# Patient Record
Sex: Female | Born: 1965 | Race: White | Hispanic: No | Marital: Married | Smoking: Never smoker
Health system: Southern US, Community
[De-identification: ages and names within clinical notes are randomized; demographics above are authoritative.]

## PROBLEM LIST (undated history)

## (undated) DIAGNOSIS — B029 Zoster without complications: Secondary | ICD-10-CM

## (undated) DIAGNOSIS — K589 Irritable bowel syndrome without diarrhea: Secondary | ICD-10-CM

## (undated) DIAGNOSIS — C439 Malignant melanoma of skin, unspecified: Secondary | ICD-10-CM

## (undated) DIAGNOSIS — R002 Palpitations: Secondary | ICD-10-CM

## (undated) DIAGNOSIS — M3500 Sicca syndrome, unspecified: Principal | ICD-10-CM

## (undated) DIAGNOSIS — I671 Cerebral aneurysm, nonruptured: Secondary | ICD-10-CM

## (undated) HISTORY — PX: OTHER SURGICAL HISTORY: SHX169

## (undated) HISTORY — PX: LASIK: SHX215

## (undated) HISTORY — DX: Irritable bowel syndrome, unspecified: K58.9

## (undated) HISTORY — DX: Sicca syndrome, unspecified: M35.00

## (undated) HISTORY — PX: COLONOSCOPY: SHX174

## (undated) HISTORY — PX: ABDOMINAL HYSTERECTOMY: SHX81

## (undated) HISTORY — DX: Palpitations: R00.2

## (undated) HISTORY — PX: WISDOM TOOTH EXTRACTION: SHX21

## (undated) HISTORY — PX: BREAST BIOPSY: SHX20

## (undated) HISTORY — DX: Zoster without complications: B02.9

---

## 1999-01-22 ENCOUNTER — Other Ambulatory Visit: Admission: RE | Admit: 1999-01-22 | Discharge: 1999-01-22 | Payer: Self-pay | Admitting: Obstetrics & Gynecology

## 1999-05-17 ENCOUNTER — Encounter: Payer: Self-pay | Admitting: Otolaryngology

## 1999-05-17 ENCOUNTER — Ambulatory Visit (HOSPITAL_COMMUNITY): Admission: RE | Admit: 1999-05-17 | Discharge: 1999-05-17 | Payer: Self-pay | Admitting: Otolaryngology

## 2000-01-24 ENCOUNTER — Other Ambulatory Visit: Admission: RE | Admit: 2000-01-24 | Discharge: 2000-01-24 | Payer: Self-pay | Admitting: Obstetrics & Gynecology

## 2000-04-26 ENCOUNTER — Ambulatory Visit (HOSPITAL_COMMUNITY): Admission: RE | Admit: 2000-04-26 | Discharge: 2000-04-26 | Payer: Self-pay | Admitting: Obstetrics & Gynecology

## 2000-04-26 ENCOUNTER — Encounter: Payer: Self-pay | Admitting: Obstetrics & Gynecology

## 2001-02-12 ENCOUNTER — Other Ambulatory Visit: Admission: RE | Admit: 2001-02-12 | Discharge: 2001-02-12 | Payer: Self-pay | Admitting: Obstetrics & Gynecology

## 2002-02-14 ENCOUNTER — Other Ambulatory Visit: Admission: RE | Admit: 2002-02-14 | Discharge: 2002-02-14 | Payer: Self-pay | Admitting: Obstetrics & Gynecology

## 2002-04-26 ENCOUNTER — Encounter (INDEPENDENT_AMBULATORY_CARE_PROVIDER_SITE_OTHER): Payer: Self-pay | Admitting: Specialist

## 2002-04-26 ENCOUNTER — Observation Stay (HOSPITAL_COMMUNITY): Admission: RE | Admit: 2002-04-26 | Discharge: 2002-04-28 | Payer: Self-pay | Admitting: Obstetrics & Gynecology

## 2002-04-26 ENCOUNTER — Encounter: Payer: Self-pay | Admitting: Obstetrics & Gynecology

## 2003-03-10 ENCOUNTER — Other Ambulatory Visit: Admission: RE | Admit: 2003-03-10 | Discharge: 2003-03-10 | Payer: Self-pay | Admitting: Obstetrics & Gynecology

## 2003-03-12 ENCOUNTER — Encounter: Admission: RE | Admit: 2003-03-12 | Discharge: 2003-03-12 | Payer: Self-pay | Admitting: Obstetrics & Gynecology

## 2003-03-12 ENCOUNTER — Encounter: Payer: Self-pay | Admitting: Obstetrics & Gynecology

## 2003-06-04 ENCOUNTER — Ambulatory Visit (HOSPITAL_COMMUNITY): Admission: RE | Admit: 2003-06-04 | Discharge: 2003-06-04 | Payer: Self-pay | Admitting: Gastroenterology

## 2003-07-08 ENCOUNTER — Ambulatory Visit (HOSPITAL_COMMUNITY): Admission: RE | Admit: 2003-07-08 | Discharge: 2003-07-08 | Payer: Self-pay | Admitting: Gastroenterology

## 2003-07-08 ENCOUNTER — Encounter: Payer: Self-pay | Admitting: Gastroenterology

## 2003-08-14 ENCOUNTER — Ambulatory Visit (HOSPITAL_COMMUNITY): Admission: RE | Admit: 2003-08-14 | Discharge: 2003-08-14 | Payer: Self-pay | Admitting: General Surgery

## 2003-08-14 ENCOUNTER — Encounter (INDEPENDENT_AMBULATORY_CARE_PROVIDER_SITE_OTHER): Payer: Self-pay | Admitting: Specialist

## 2004-04-15 ENCOUNTER — Other Ambulatory Visit: Admission: RE | Admit: 2004-04-15 | Discharge: 2004-04-15 | Payer: Self-pay | Admitting: Obstetrics & Gynecology

## 2004-11-30 ENCOUNTER — Encounter: Admission: RE | Admit: 2004-11-30 | Discharge: 2004-11-30 | Payer: Self-pay | Admitting: Family Medicine

## 2005-05-18 ENCOUNTER — Other Ambulatory Visit: Admission: RE | Admit: 2005-05-18 | Discharge: 2005-05-18 | Payer: Self-pay | Admitting: Obstetrics & Gynecology

## 2006-02-07 ENCOUNTER — Ambulatory Visit: Payer: Self-pay | Admitting: Internal Medicine

## 2006-06-07 ENCOUNTER — Encounter: Admission: RE | Admit: 2006-06-07 | Discharge: 2006-06-07 | Payer: Self-pay | Admitting: Obstetrics & Gynecology

## 2006-07-24 ENCOUNTER — Encounter: Admission: RE | Admit: 2006-07-24 | Discharge: 2006-10-22 | Payer: Self-pay | Admitting: Otolaryngology

## 2006-09-15 ENCOUNTER — Emergency Department (HOSPITAL_COMMUNITY): Admission: EM | Admit: 2006-09-15 | Discharge: 2006-09-15 | Payer: Self-pay | Admitting: Emergency Medicine

## 2007-05-25 ENCOUNTER — Ambulatory Visit: Payer: Self-pay | Admitting: Internal Medicine

## 2007-06-05 ENCOUNTER — Encounter: Admission: RE | Admit: 2007-06-05 | Discharge: 2007-06-05 | Payer: Self-pay | Admitting: Internal Medicine

## 2007-07-17 ENCOUNTER — Encounter: Admission: RE | Admit: 2007-07-17 | Discharge: 2007-07-17 | Payer: Self-pay | Admitting: Obstetrics & Gynecology

## 2008-05-22 DIAGNOSIS — J45909 Unspecified asthma, uncomplicated: Secondary | ICD-10-CM | POA: Insufficient documentation

## 2008-05-22 DIAGNOSIS — K589 Irritable bowel syndrome without diarrhea: Secondary | ICD-10-CM | POA: Insufficient documentation

## 2009-05-14 ENCOUNTER — Ambulatory Visit (HOSPITAL_COMMUNITY): Admission: RE | Admit: 2009-05-14 | Discharge: 2009-05-14 | Payer: Self-pay | Admitting: Internal Medicine

## 2009-08-16 ENCOUNTER — Emergency Department (HOSPITAL_COMMUNITY): Admission: EM | Admit: 2009-08-16 | Discharge: 2009-08-16 | Payer: Self-pay | Admitting: Emergency Medicine

## 2009-08-22 ENCOUNTER — Ambulatory Visit: Payer: Self-pay | Admitting: Family Medicine

## 2009-08-25 ENCOUNTER — Encounter: Admission: RE | Admit: 2009-08-25 | Discharge: 2009-08-25 | Payer: Self-pay | Admitting: Internal Medicine

## 2009-09-28 ENCOUNTER — Ambulatory Visit (HOSPITAL_COMMUNITY): Admission: RE | Admit: 2009-09-28 | Discharge: 2009-09-28 | Payer: Self-pay | Admitting: Specialist

## 2009-10-07 ENCOUNTER — Encounter: Admission: RE | Admit: 2009-10-07 | Discharge: 2009-10-07 | Payer: Self-pay | Admitting: Obstetrics & Gynecology

## 2010-11-16 ENCOUNTER — Encounter
Admission: RE | Admit: 2010-11-16 | Discharge: 2010-11-16 | Payer: Self-pay | Source: Home / Self Care | Attending: Obstetrics & Gynecology | Admitting: Obstetrics & Gynecology

## 2010-12-02 NOTE — Assessment & Plan Note (Signed)
Summary: Cough- green, sorethroat x 1 wk rm 3   Vital Signs:  Patient Profile:   45 Years Old Female CC:      Cold & URI symptoms Height:     65.5 inches Weight:      120 pounds O2 Sat:      100 % O2 treatment:    Room Air Temp:     98.1 degrees F oral Pulse rate:   88 / minute Pulse rhythm:   regular Resp:     16 per minute BP sitting:   116 / 71  (right arm) Cuff size:   regular  Vitals Entered By: Areta Haber CMA (August 22, 2009 11:22 AM)                  Prior Medication List:  SINGULAIR 10 MG  TABS (MONTELUKAST SODIUM) take 1 by mouth once daily for allergies CALCIUM 500 MG  TABS (CALCIUM CARBONATE) take 1 by mouth once daily MULTIVITAMINS   TABS (MULTIPLE VITAMIN) take 1 by mouth once daily VITAMIN D 1000 UNIT  TABS (CHOLECALCIFEROL) CHECK DOSING WITH PT   Current Allergies (reviewed today): ! * SUNSCREEN  History of Present Illness Chief Complaint: Cold & URI symptoms History of Present Illness: Coughing and cold for 8-9 days. Seen at [facility] urgent care a week ago not better. Rapid strep was negative.Patient feels miserable and has been coughng and w/ as sore throat. Productive cough worse in AM . Green and thick.  Current Problems: ACUTE NASOPHARYNGITIS (ICD-460) UPPER RESPIRATORY INFECTION, ACUTE (ICD-465.9) BRONCHITIS, ACUTE (ICD-466.0) FAMILY HISTORY DIABETES 1ST DEGREE RELATIVE (ICD-V18.0) IRRITABLE BOWEL SYNDROME (ICD-564.1) COUGH (ICD-786.2) ASTHMA (ICD-493.90)   Current Meds SINGULAIR 10 MG  TABS (MONTELUKAST SODIUM) take 1 by mouth once daily for allergies CALCIUM 500 MG  TABS (CALCIUM CARBONATE) take 1 by mouth once daily MULTIVITAMINS   TABS (MULTIPLE VITAMIN) take 1 by mouth once daily VITAMIN D 1000 UNIT  TABS (CHOLECALCIFEROL) CHECK DOSING WITH PT ZITHROMAX Z-PAK 250 MG  TABS (AZITHROMYCIN) Use as directed TUSSIONEX PENNKINETIC ER 8-10 MG/5ML LQCR (CHLORPHENIRAMINE-HYDROCODONE) 1 tsp by mouth twice aday PROVENTIL HFA 108 (90 BASE)  MCG/ACT AERS (ALBUTEROL SULFATE) 2 puff 3x aday as needed for bronchitis  REVIEW OF SYSTEMS Constitutional Symptoms      Denies fever, chills, night sweats, weight loss, weight gain, and fatigue.  Eyes       Denies change in vision, eye pain, eye discharge, glasses, contact lenses, and eye surgery. Ear/Nose/Throat/Mouth       Complains of sore throat.      Denies hearing loss/aids, change in hearing, ear pain, ear discharge, dizziness, frequent runny nose, frequent nose bleeds, sinus problems, hoarseness, and tooth pain or bleeding.  Respiratory       Complains of productive cough and shortness of breath.      Denies dry cough, wheezing, asthma, bronchitis, and emphysema/COPD.      Comments: hx of reactive airway Cardiovascular       Denies murmurs, chest pain, and tires easily with exhertion.    Gastrointestinal       Denies stomach pain, nausea/vomiting, diarrhea, constipation, blood in bowel movements, and indigestion. Genitourniary       Denies painful urination, kidney stones, and loss of urinary control. Neurological       Denies paralysis, seizures, and fainting/blackouts. Musculoskeletal       Denies muscle pain, joint pain, joint stiffness, decreased range of motion, redness, swelling, muscle weakness, and gout.  Skin  Denies bruising, unusual mles/lumps or sores, and hair/skin or nail changes.  Psych       Denies mood changes, temper/anger issues, anxiety/stress, speech problems, depression, and sleep problems. Other Comments: Cough - green x 1 wk   Past History:  Family History: Last updated: 08/22/2009 Family History of Arthritis Family History Diabetes 1st degree relative Family History of Cardiovascular disorder  Past Medical History: Asthma Mouth sores  Past Surgical History: Sinus surgery Lasik  Family History: Reviewed history and no changes required. Family History of Arthritis Family History Diabetes 1st degree relative Family History of  Cardiovascular disorder Physical Exam General appearance: well developed, well nourished, moderate discomfort Head: normocephalic, atraumatic Ears: normal, no lesions or deformities Nasal: pale, boggy, swollen nasal turbinates Oral/Pharynx: pharyngeal erythema without exudate, uvula midline without deviation Neck: neck supple,  trachea midline, no masses Chest/Lungs: no rales, wheezes, or rhonchi bilateral, breath sounds equal without effort Heart: regular rate and  rhythm, no murmur Skin: no obvious rashes or lesions MSE: oriented to time, place, and person Assessment New Problems: ACUTE NASOPHARYNGITIS (ICD-460) UPPER RESPIRATORY INFECTION, ACUTE (ICD-465.9) BRONCHITIS, ACUTE (ICD-466.0) FAMILY HISTORY DIABETES 1ST DEGREE RELATIVE (ICD-V18.0)  bronchitis w/ Hx of reactive airway DX   Patient Education: Patient and/or caregiver instructed in the following: rest fluids and Tylenol.  Plan New Medications/Changes: PROVENTIL HFA 108 (90 BASE) MCG/ACT AERS (ALBUTEROL SULFATE) 2 puff 3x aday as needed for bronchitis  #1 x 0, 08/22/2009, Hassan Rowan MD TUSSIONEX PENNKINETIC ER 8-10 MG/5ML LQCR (CHLORPHENIRAMINE-HYDROCODONE) 1 tsp by mouth twice aday  #30floz x 0, 08/22/2009, Hassan Rowan MD ZITHROMAX Z-PAK 250 MG  TABS (AZITHROMYCIN) Use as directed  #1 x 0, 08/22/2009, Hassan Rowan MD  New Orders: Est. Patient Level III [16109] Rapid Strep [60454] Planning Comments:   will give work note if needed  Follow Up: Follow up in 2-3 days if no improvement Work/School Excuse: Return to work/school in 3 days  The patient and/or caregiver has been counseled thoroughly with regard to medications prescribed including dosage, schedule, interactions, rationale for use, and possible side effects and they verbalize understanding.  Diagnoses and expected course of recovery discussed and will return if not improved as expected or if the condition worsens. Patient and/or caregiver verbalized  understanding.  Prescriptions: PROVENTIL HFA 108 (90 BASE) MCG/ACT AERS (ALBUTEROL SULFATE) 2 puff 3x aday as needed for bronchitis  #1 x 0   Entered and Authorized by:   Hassan Rowan MD   Signed by:   Hassan Rowan MD on 08/22/2009   Method used:   Print then Give to Patient   RxID:   [id number] TUSSIONEX PENNKINETIC ER 8-10 MG/5ML LQCR (CHLORPHENIRAMINE-HYDROCODONE) 1 tsp by mouth twice aday  #71floz x 0   Entered and Authorized by:   Hassan Rowan MD   Signed by:   Hassan Rowan MD on 08/22/2009   Method used:   Print then Give to Patient   RxID:   [id number] ZITHROMAX Z-PAK 250 MG  TABS (AZITHROMYCIN) Use as directed  #1 x 0   Entered and Authorized by:   Hassan Rowan MD   Signed by:   Hassan Rowan MD on 08/22/2009   Method used:   Print then Give to Patient   RxID:   [id number]   Patient Instructions: 1)  Please schedule a follow-up appointment as needed. 2)  Please schedule an appointment with your primary doctor in : 3)  Take your antibiotic as prescribed until ALL of it is gone, but stop if you develop  a rash or swelling and contact our office as soon as possible. 4)  Acute bronchitis symptoms for less than 10 days are not helped by antibiotics. take over the counter cough medications. call if no improvment in  5-7 days, sooner if increasing cough, fever, or new symptoms( shortness of breath, chest pain). 5)  Recommended remaining out of work for

## 2010-12-02 NOTE — Letter (Signed)
Summary: Out of Work  [facility]  [address]   [place], [place] [address]   Phone: [phone]  Fax: [phone]    August 22, 2009   Employee:  [employer]    To Whom It May Concern:   For Medical reasons, please excuse the above named employee from work for the following dates:  Start:   08/22/2009  Return:   08/25/2009  If you need additional information, please feel free to contact our office.         Sincerely,    Hassan Rowan MD

## 2010-12-29 ENCOUNTER — Other Ambulatory Visit: Payer: Self-pay | Admitting: Dermatology

## 2011-01-06 ENCOUNTER — Other Ambulatory Visit: Payer: Self-pay | Admitting: Dermatology

## 2011-02-03 LAB — POCT RAPID STREP A (OFFICE): Streptococcus, Group A Screen (Direct): NEGATIVE

## 2011-03-15 NOTE — Assessment & Plan Note (Signed)
[facility]                             PULMONARY OFFICE NOTE   Stefanie [name], Stefanie [name]                        MRN:          [id number]  DATE:05/25/2007                            DOB:          1966-06-20    PROBLEM:  Asthma/cough.   HISTORY:  She finds Singulair helps her recover more quickly from  bronchitis associated with viral illnesses. If she skips it, she says  that she coughs persistently, but rarely wheezes. She was recently  treated for pneumonia as an outpatient with doxycycline and symptoms  have completely resolved. A chest x-ray was done at Dr. Jone Baseman  office. She saw Dr. Lazarus Salines for persistent hoarseness and he noted some  non-specific laryngeal thickening by her description, but no evidence of  reflux and I cannot get a history suggesting reflux from her at this  time.   MEDICATIONS:  1. Singulair.  2. Calcium.  3. Multivitamins.  4. Vitamin D.   OBJECTIVE:  Weight 120 pounds, blood pressure 98/52, pulse 65, room air  saturation 100%. She is not hoarse, breathing is unlabored, and there is  no strider. There is no obvious post-nasal drainage. Lung fields sound  clear. Heart sounds are normal.   IMPRESSION:  We have felt that this was probably cough equivalent asthma  and Singulair seems to do a good job of controlling her.   PLAN:  We discussed options. She can try dropping the Singulair dose to  1/2 of a 10 mg daily to save money if that will work for her. Schedule  return 1 year, earlier p.r.n.     Clinton D. Maple Hudson, MD, Tonny Bollman, FACP  Electronically Signed    CDY/MedQ  DD: 05/25/2007  DT: 05/27/2007  Job #: [id number]   cc:   Thora Lance, M.D.  Amy Y. [place], M.D.  Gloris Manchester. Lazarus Salines, M.D.

## 2011-03-18 NOTE — H&P (Signed)
[facility]  Patient:    Stefanie [name], Stefanie [name] Visit Number: [id number] MRN: [id number]          Service Type: Attending:  Freddy Finner, M.D. Dictated by:   Freddy Finner, M.D. Adm. Date:  05/07/02                           History and Physical  ADMITTING DIAGNOSIS:  Uterine adenomyosis, pelvic endometriosis, chronic pelvic pain, multiparity.  HISTORY OF PRESENT ILLNESS:  Patient is a 45 year old white married female with two living children who is currently using an IUD for contraception who has a long history of pelvic pain but has noted a marked increase in pain over the last two to three months.  The pain is concentrated in the lower abdomen, greatest on the left but present bilaterally.  On pelvic examination in the office, her uterus is noted to be enlarged, retroverted in position, 8 to [redacted] weeks gestational size, and moderately tender to palpation.  There is left adnexal tenderness but no definite palpable mass.  The right adnexa is palpably normal.  In considering options of therapy, we have discussed at length laparoscopy with possible uterosacral nerve ablation.  Given the fact that she has completed her family, she has requested a more definitive surgical intervention given her abnormal physical findings and the chronic nature of her symptoms, and she is admitted now for laparoscopically assisted vaginal hysterectomy, and bilateral salpingo-oophorectomy only if findings are significantly abnormal with the ovaries.  There is a cystic mass noted in the myometrium on pelvic ultrasound measuring 5 x 4 mm and this is consistent with adenomyosis.  REVIEW OF SYSTEMS:  Her current review of systems is otherwise negative but no cardiopulmonary, GI, or GU complaints.  PAST MEDICAL HISTORY:  Patient is known to have irritable bowel symptoms which have been adequately treated with diet.  She has a history of migraine headaches.  She has no other known  significant medical illnesses.  ALLERGIES:  She is known to be allergic to SULFA but no other medication allergies.  PAST SURGICAL HISTORY:  Surgical delivery x 2, most recent in 1999.  She has no significant surgical procedures.  She has never had a blood transfusion, does not use cigarettes.  FAMILY HISTORY:  Noncontributory.  PHYSICAL EXAMINATION:  VITAL SIGNS:  Blood pressure in the office was 100/69.  HEENT:  Grossly within normal limits.  Thyroid gland is not palpably enlarged.  BREASTS:  Exam is considered to be normal.  HEART:  Normal sinus rhythm without murmurs, rubs, or gallops.  CHEST:  Clear to auscultation.  ABDOMEN:  Soft.  There is some tenderness to deep palpation in the left lower quadrant.  PELVIC:  The external genitalia, vagina, and cervix are normal.  Bimanual reveals the uterus to be 8 to 9 weeks size and mild to moderately tender. There is tender fullness in the left adnexa.  RECTUM:  Normal.  Rectovaginal exam confirms the above findings.  EXTREMITIES:  Without cyanosis, clubbing, or edema.  ASSESSMENT:  Known pelvic endometriosis, known tender uterus consistent with adenomyosis.  PLAN:  Laparoscopically assisted vaginal hysterectomy. Dictated by:   Freddy Finner, M.D. Attending:  Freddy Finner, M.D. DD:  04/24/02 TD:  04/24/02 Job: [id number]

## 2011-03-18 NOTE — Op Note (Signed)
NAME:  Stefanie [name], Stefanie [name]                           ACCOUNT NO.:  [id number]   MEDICAL RECORD NO.:  [id number]                   PATIENT TYPE:  AMB   LOCATION:  DAY                                  FACILITY:  [facility]   PHYSICIAN:  Timothy E. Earlene Plater, M.D.              DATE OF BIRTH:  1965/11/11   DATE OF PROCEDURE:  08/14/2003  DATE OF DISCHARGE:                                 OPERATIVE REPORT   PREOPERATIVE DIAGNOSES:  1. Skin lesion, left breast.  2. Skin breast mass, left breast.   OPERATIVE PROCEDURES:  1. Excision of breast mass.  2. Excision of skin lesion.   SURGEON:  Timothy E. Earlene Plater, M.D.   ANESTHESIA:  Local standby.   Ms. Asebedo saw me earlier this week for a more painful and enlarging mass,  left breast.  By mutual agreement we agreed to proceed with excisional  biopsy.  This is located in the 6 o'clock position, left breast.  She also  has a multicolored, irregular skin lesion in the upper outer quadrant skin  of left breast.  We will remove that at the same time.  She is identified  and the permit signed.  The lesions are identified and marked.   She is taken to the operating room, placed supine, IV sedation given.  The  left breast was prepped and draped in the usual fashion.  Marcaine 0.25%  Marcaine was used throughout for local anesthesia.  A horizontal incision  was made in the 6 o'clock position of the breast approximately 4 cm from the  areolar edge over the palpable lesion.  The scant subcutaneous tissue was  dissected.  The irregular mass was identified and grasped and sharply  dissected from the surrounding tissue.  Bleeding was carefully controlled.  The wound was dry.  The wound was closed with 3-0 Vicryl and 4-0 Monocryl.  Specimen submitted:  #1, breast mass.  The multicolored skin lesion on the  skin of the upper outer quadrant was likewise locally anesthetized and  excised as an ellipsed, undermined the edges, and closed with  layers of 4-0 Monocryl and  4-0 nylon.  Submitted as, #2, skin lesion.  She  tolerated it well, Steri-Strips applied, dry sterile dressing applied, and  she was removed to the recovery room in good condition.  Careful written and  verbal instructions were given, including Vicodin #24, refill one, and she  will be followed as an outpatient.                                                 Timothy E. Earlene Plater, M.D.    TED/MEDQ  D:  08/14/2003  T:  08/14/2003  Job:  [id number]   cc:   Freddy Finner, M.D.  [address]  [place]  [place]  [place] [address]  Fax: [phone]

## 2011-03-18 NOTE — H&P (Signed)
[facility]  Patient:    Stefanie [name], Stefanie [name] Visit Number: [id number] MRN: [id number]          Service Type: Attending:  Freddy Finner, M.D. Dictated by:   Freddy Finner, M.D. Adm. Date:  04/26/02                           History and Physical  ADDENDUM TO [phone]  I noted in there that the date of her surgery at admission would be May 07, 2002, but that date has been changed.  Her new admission date is April 26, 2002.  The date of the dictation was April 24, 2002. Dictated by:   Freddy Finner, M.D. Attending:  Freddy Finner, M.D. DD:  04/24/02 TD:  04/24/02 Job: [id number]

## 2011-03-18 NOTE — Op Note (Signed)
[facility]  Patient:    Stefanie [name], Stefanie [name] Visit Number: [id number] MRN: [id number]          Service Type: SUR Location: 4W 0455 01 Attending Physician:  Minette Headland Dictated by:   Freddy Finner, M.D. Proc. Date: 04/26/02 Admit Date:  04/26/2002                             Operative Report  PREOPERATIVE DIAGNOSES:  Enlarged tender uterus consistent with adenomyosis, chronic pelvic pain with left pelvic pain greater than right.  POSTOPERATIVE DIAGNOSES:  Enlarged tender uterus consistent with adenomyosis, chronic pelvic pain with left pelvic pain greater than right. Modeled irregular enlargement of the uterus. There was a follicular appearing totally benign cyst in the left ovary which was left in place. There was no evidence of peritoneal disease. The appendix was normal as was the upper abdomen by scanning inspection. Photographs of the findings were made and retained in the office record.  DESCRIPTION OF PROCEDURE:  The patient was admitted on the morning of surgery, brought to the operating room after receiving a gram of Cefotan and being placed in PAS hose. The abdomen, perineum and vagina were prepped in the usual fashion, sterile drapes were applied, the bladder was evacuated with a Robinson catheter. A Hulka tenaculum was attached to the cervix without difficulty. Two small incisions were made in the abdomen, one at the umbilicus through which a 12 mm trocar was introduced while elevating the anterior abdominal wall. Direction inspection revealed adequate placement and no evidence of injury on entry. Pneumoperitoneum was allowed to accumulated. A 5 mm trocar was placed through a lower small incision and through it a probe and later the Nezhat irrigating system was used. After systematically examining the pelvis and the abdomen, it was elected to proceed with the dissection vaginally because of the adequate descent of the uterus and  the lack of availability of the tripolar dissecting and coagulation device that would operate through the operating channel of the laparoscope. A posterior weighted vaginal retractor was then placed. Deavers were used to retract the anterior and lateral vaginal walls. The cervix was grasped with a Jacobs tenaculum, colpotomy incision was made while tinting the mucosa posterior to the cervix. The cervix was circumscribed with a scalpel. Curved Heaneys were used to cross clamp the uterosacrals which were divided and ligated with suture ligature of #0 Monocryl in a Heaney fashion. The ligasure system was then used to develop the bladder pillars which were sealed and divided. The bladder was advanced off the cervix, the cardinal ligament pedicle was taken with the ligasure system sealed and divided. The anterior peritoneum was entered. Vessel pedicles were taken with the Ligasure system as well as one pedicle just above the vessels on each side. Each of these were sealed and divided sharply. The uterus was delivered through the vaginal introitus. The ligasure system was then used to seal the ovarian pedicles on each side and the uterus was completely excised. The angles of the vagina were then anchored to the uterosacrals with a mattress suture of #0 Monocryl. The uterosacrals were plicated and the posterior peritoneum closed with an interrupted #0 Monocryl suture. The cuff was closed vertically with figure-of-eights of #0 Monocryl. A Foley catheter was placed. Reinspection laparoscopically revealed complete hemostasis. The procedure was terminated, gas was allowed to escape from the abdomen. The umbilical incision was closed in two layers. The #0 Vicryl was  used to close the fascia on a UR-6 needle. The skin was closed with interrupted subcuticular sutures of 3-0 Dexon. The 0.5% plain Marcaine was injected into the incision sites for postoperative analgesia. Steri-Strips were applied to the  incision sites. The patient was awakened and taken to recovery in good condition. Dictated by:   Freddy Finner, M.D. Attending Physician:  Minette Headland DD:  04/26/02 TD:  04/26/02 Job: [id number]

## 2011-06-20 ENCOUNTER — Other Ambulatory Visit (HOSPITAL_COMMUNITY): Payer: Self-pay | Admitting: Neurology

## 2011-06-20 DIAGNOSIS — R251 Tremor, unspecified: Secondary | ICD-10-CM

## 2011-06-20 DIAGNOSIS — R439 Unspecified disturbances of smell and taste: Secondary | ICD-10-CM

## 2011-06-20 DIAGNOSIS — R259 Unspecified abnormal involuntary movements: Secondary | ICD-10-CM

## 2011-06-21 ENCOUNTER — Ambulatory Visit (HOSPITAL_COMMUNITY)
Admission: RE | Admit: 2011-06-21 | Discharge: 2011-06-21 | Disposition: A | Discharge status: Home / Self Care | Payer: 59 | Source: Ambulatory Visit | Attending: Neurology | Admitting: Neurology

## 2011-06-21 DIAGNOSIS — R251 Tremor, unspecified: Secondary | ICD-10-CM

## 2011-06-21 DIAGNOSIS — R259 Unspecified abnormal involuntary movements: Secondary | ICD-10-CM | POA: Insufficient documentation

## 2011-06-21 DIAGNOSIS — R439 Unspecified disturbances of smell and taste: Secondary | ICD-10-CM | POA: Insufficient documentation

## 2011-06-21 LAB — VITAMIN B12: Vitamin B-12: 355 pg/mL (ref 211–911)

## 2011-06-21 MED ORDER — GADOBENATE DIMEGLUMINE 529 MG/ML IV SOLN
10.0000 mL | Freq: Once | INTRAVENOUS | Status: AC
  Administered 2011-06-21: 10 mL via INTRAVENOUS

## 2011-11-01 DIAGNOSIS — C439 Malignant melanoma of skin, unspecified: Secondary | ICD-10-CM

## 2011-11-01 HISTORY — DX: Malignant melanoma of skin, unspecified: C43.9

## 2011-11-07 ENCOUNTER — Other Ambulatory Visit: Payer: Self-pay | Admitting: Obstetrics & Gynecology

## 2011-11-07 DIAGNOSIS — Z1231 Encounter for screening mammogram for malignant neoplasm of breast: Secondary | ICD-10-CM

## 2011-11-23 ENCOUNTER — Ambulatory Visit
Admission: RE | Admit: 2011-11-23 | Discharge: 2011-11-23 | Disposition: A | Discharge status: Home / Self Care | Payer: 59 | Source: Ambulatory Visit | Attending: Obstetrics & Gynecology | Admitting: Obstetrics & Gynecology

## 2011-11-23 DIAGNOSIS — Z1231 Encounter for screening mammogram for malignant neoplasm of breast: Secondary | ICD-10-CM

## 2011-12-28 ENCOUNTER — Emergency Department
Admission: EM | Admit: 2011-12-28 | Discharge: 2011-12-28 | Disposition: A | Discharge status: Home Health | Payer: 59 | Source: Home / Self Care | Attending: Emergency Medicine | Admitting: Emergency Medicine

## 2011-12-28 ENCOUNTER — Encounter: Payer: Self-pay | Admitting: Emergency Medicine

## 2011-12-28 DIAGNOSIS — J329 Chronic sinusitis, unspecified: Secondary | ICD-10-CM

## 2011-12-28 DIAGNOSIS — J069 Acute upper respiratory infection, unspecified: Secondary | ICD-10-CM

## 2011-12-28 MED ORDER — AZITHROMYCIN 250 MG PO TABS: ORAL_TABLET | ORAL | Status: AC

## 2011-12-28 NOTE — ED Notes (Signed)
Had URI with Flu-like symptoms week of Feb.11th; never totally resolved and wonders if now has sinus infection. Did have Flu vaccination this season.

## 2011-12-28 NOTE — ED Provider Notes (Signed)
History     CSN: [id number]  Arrival date & time 12/28/11  1859   First MD Initiated Contact with Patient 12/28/11 1907      Chief Complaint  Patient presents with  . Sinusitis    (Consider location/radiation/quality/duration/timing/severity/associated sxs/prior treatment) HPI Stefanie [name] is a 47 y.o. female who complains of onset of cold symptoms for  10 days.  + Flu vaccine this year.  She started off mostly with flulike symptoms in those toes since resolved, however she continues to have some sinus pressure and she wonders if she has sinus infection now. Her current symptoms are: No sore throat +/- cough No pleuritic pain No wheezing + nasal congestion + mild hoarseness + post-nasal drainage + sinus pain/pressure No chest congestion No itchy/red eyes No earache No hemoptysis No SOB No chills/sweats No fever No nausea No vomiting No abdominal pain No diarrhea No skin rashes No fatigue No myalgias + headache    History reviewed. No pertinent past medical history.  Past Surgical History  Procedure Date  . Speenoidectomy   . Lasik   . Abdominal hysterectomy     Family History  Problem Relation Age of Onset  . Rheum arthritis Mother   . Diabetes Father   . Hypertension Father     History  Substance Use Topics  . Smoking status: Never Smoker   . Smokeless tobacco: Not on file  . Alcohol Use: No    OB History    Grav Para Term Preterm Abortions TAB SAB Ect Mult Living                  Review of Systems  All other systems reviewed and are negative.    Allergies  Morphine and related; Sulfa antibiotics; and Sunscreen  Home Medications   Current Outpatient Rx  Name Route Sig Dispense Refill  . AZITHROMYCIN 250 MG PO TABS  Use as directed 1 each 0    BP 119/80  Pulse 61  Temp(Src) 97.8 F (36.6 C) (Oral)  Resp 18  Ht 5' 2.5" (1.588 m)  Wt 115 lb (52.164 kg)  BMI 20.70 kg/m2  SpO2 100%  Physical Exam  Nursing note and vitals  reviewed. Constitutional: She is oriented to person, place, and time. She appears well-developed and well-nourished.  HENT:  Head: Normocephalic and atraumatic.  Right Ear: Tympanic membrane, external ear and ear canal normal.  Left Ear: Tympanic membrane, external ear and ear canal normal.  Nose: Rhinorrhea present.  Mouth/Throat: Posterior oropharyngeal erythema present. No oropharyngeal exudate or posterior oropharyngeal edema.  Eyes: No scleral icterus.  Neck: Neck supple.  Cardiovascular: Regular rhythm and normal heart sounds.   Pulmonary/Chest: Effort normal and breath sounds normal. No respiratory distress.  Neurological: She is alert and oriented to person, place, and time.  Skin: Skin is warm and dry.  Psychiatric: She has a normal mood and affect. Her speech is normal.    ED Course  Procedures (including critical care time)  Labs Reviewed - No data to display No results found.   1. Sinusitis   2. Acute upper respiratory infections of unspecified site       MDM  1)  Take the prescribed antibiotic as instructed.  UA culture and urine he 2)  Use nasal saline solution (over the counter) at least 3 times a day. 3)  Use over the counter decongestants like Zyrtec-D every 12 hours as needed to help with congestion.  If you have hypertension, do not take medicines with sudafed.  4)  Can take tylenol every 6 hours or motrin every 8 hours for pain or fever. 5)  Follow up with your primary doctor if no improvement in 5-7 days, sooner if increasing pain, fever, or new symptoms.        Lily Kocher, MD 12/28/11 [phone]

## 2012-02-22 ENCOUNTER — Other Ambulatory Visit: Payer: Self-pay | Admitting: Dermatology

## 2012-04-22 ENCOUNTER — Encounter (HOSPITAL_BASED_OUTPATIENT_CLINIC_OR_DEPARTMENT_OTHER): Payer: Self-pay | Admitting: Emergency Medicine

## 2012-04-22 ENCOUNTER — Emergency Department (HOSPITAL_BASED_OUTPATIENT_CLINIC_OR_DEPARTMENT_OTHER): Payer: 59

## 2012-04-22 ENCOUNTER — Observation Stay (HOSPITAL_BASED_OUTPATIENT_CLINIC_OR_DEPARTMENT_OTHER)
Admission: EM | Admit: 2012-04-22 | Discharge: 2012-04-23 | Disposition: A | Discharge status: Home / Self Care | Payer: 59 | Source: Ambulatory Visit | Attending: Emergency Medicine | Admitting: Emergency Medicine

## 2012-04-22 DIAGNOSIS — I671 Cerebral aneurysm, nonruptured: Secondary | ICD-10-CM

## 2012-04-22 DIAGNOSIS — R51 Headache: Secondary | ICD-10-CM

## 2012-04-22 DIAGNOSIS — G459 Transient cerebral ischemic attack, unspecified: Principal | ICD-10-CM | POA: Insufficient documentation

## 2012-04-22 LAB — CBC
HCT: 36.8 % (ref 36.0–46.0)
HCT: 38.8 % (ref 36.0–46.0)
Hemoglobin: 13.3 g/dL (ref 12.0–15.0)
Hemoglobin: 13.7 g/dL (ref 12.0–15.0)
MCH: 30 pg (ref 26.0–34.0)
MCH: 29.5 pg (ref 26.0–34.0)
MCHC: 36.1 g/dL — ABNORMAL HIGH (ref 30.0–36.0)
MCHC: 35.3 g/dL (ref 30.0–36.0)
MCV: 82.9 fL (ref 78.0–100.0)
MCV: 83.6 fL (ref 78.0–100.0)
Platelets: 249 10*3/uL (ref 150–400)
Platelets: 243 10*3/uL (ref 150–400)
RBC: 4.44 MIL/uL (ref 3.87–5.11)
RBC: 4.64 MIL/uL (ref 3.87–5.11)
RDW: 12.5 % (ref 11.5–15.5)
RDW: 12.5 % (ref 11.5–15.5)
WBC: 9.8 10*3/uL (ref 4.0–10.5)
WBC: 9.4 10*3/uL (ref 4.0–10.5)

## 2012-04-22 LAB — DIFFERENTIAL
Basophils Absolute: 0 10*3/uL (ref 0.0–0.1)
Basophils Relative: 0 % (ref 0–1)
Eosinophils Absolute: 0.1 10*3/uL (ref 0.0–0.7)
Eosinophils Relative: 1 % (ref 0–5)
Lymphocytes Relative: 33 % (ref 12–46)
Lymphs Abs: 3.2 10*3/uL (ref 0.7–4.0)
Monocytes Absolute: 0.9 10*3/uL (ref 0.1–1.0)
Monocytes Relative: 9 % (ref 3–12)
Neutro Abs: 5.6 10*3/uL (ref 1.7–7.7)
Neutrophils Relative %: 57 % (ref 43–77)

## 2012-04-22 LAB — COMPREHENSIVE METABOLIC PANEL
ALT: 9 U/L (ref 0–35)
AST: 15 U/L (ref 0–37)
Albumin: 4.1 g/dL (ref 3.5–5.2)
Alkaline Phosphatase: 48 U/L (ref 39–117)
BUN: 11 mg/dL (ref 6–23)
CO2: 24 mEq/L (ref 19–32)
Calcium: 9.6 mg/dL (ref 8.4–10.5)
Chloride: 106 mEq/L (ref 96–112)
Creatinine, Ser: 0.6 mg/dL (ref 0.50–1.10)
GFR calc Af Amer: >90 mL/min (ref >90)
GFR calc non Af Amer: >90 mL/min (ref >90)
Glucose, Bld: 94 mg/dL (ref 70–99)
Potassium: 3.7 mEq/L (ref 3.5–5.1)
Sodium: 140 mEq/L (ref 135–145)
Total Bilirubin: 0.4 mg/dL (ref 0.3–1.2)
Total Protein: 7.1 g/dL (ref 6.0–8.3)

## 2012-04-22 LAB — LIPID PANEL
Cholesterol: 185 mg/dL (ref 0–200)
HDL: 61 mg/dL (ref >39)
LDL Cholesterol: 109 mg/dL — ABNORMAL HIGH (ref 0–99)
Total CHOL/HDL Ratio: 3 RATIO
Triglycerides: 76 mg/dL (ref <150)
VLDL: 15 mg/dL (ref 0–40)

## 2012-04-22 LAB — CREATININE, SERUM
Creatinine, Ser: 0.72 mg/dL (ref 0.50–1.10)
GFR calc Af Amer: >90 mL/min (ref >90)
GFR calc non Af Amer: >90 mL/min (ref >90)

## 2012-04-22 LAB — TROPONIN I: Troponin I: <0.3 ng/mL (ref <0.30)

## 2012-04-22 MED ORDER — ENOXAPARIN SODIUM 40 MG/0.4ML ~~LOC~~ SOLN
40.0000 mg | SUBCUTANEOUS | Status: DC
  Administered 2012-04-22: 40 mg via SUBCUTANEOUS
  Filled 2012-04-22: qty 0.4

## 2012-04-22 MED ORDER — ACETAMINOPHEN 325 MG PO TABS: 650.0000 mg | ORAL_TABLET | ORAL | Status: DC | PRN

## 2012-04-22 NOTE — ED Provider Notes (Signed)
History     CSN: [id number]  Arrival date & time 04/22/12  1223   First MD Initiated Contact with Patient 04/22/12 1355      Chief Complaint  Patient presents with  . Nausea  . Numbness    (Consider location/radiation/quality/duration/timing/severity/associated sxs/prior treatment) Patient is a 46 y.o. female presenting with headaches. The history is provided by the patient. No language interpreter was used.  Headache  This is a new problem. The problem occurs constantly. The problem has been gradually worsening. The headache is associated with nothing.   patient reports he was at church and began having a pressure sensation in her head patient reports that she was singing and could not sing patient reports she felt like every once boluses were cutting in and out. Patient reports she began having severe nausea and numbness in her left arm patient reports arm and fingers felt tingly and numb. Patient reports she had a severe pain between her scapula patient reports she did not have any shortness of breath she denies any chest pain. Patient reports at this time the symptoms have resolved except for some tingling to her fingers patient reports she has not had anything like this in the past she denies any medical problems she did not vomit patient has not had any diarrhea  History reviewed. No pertinent past medical history.  Past Surgical History  Procedure Date  . Speenoidectomy   . Lasik   . Abdominal hysterectomy     Family History  Problem Relation Age of Onset  . Rheum arthritis Mother   . Diabetes Father   . Hypertension Father     History  Substance Use Topics  . Smoking status: Never Smoker   . Smokeless tobacco: Not on file  . Alcohol Use: No    OB History    Grav Para Term Preterm Abortions TAB SAB Ect Mult Living                  Review of Systems  Neurological: Positive for headaches.  All other systems reviewed and are negative.    Allergies    Coppertone spf4; Morphine and related; and Sulfa antibiotics  Home Medications  No current outpatient prescriptions on file.  BP 112/68  Pulse 72  Temp 98.7 F (37.1 C)  Resp 20  SpO2 99%  Physical Exam  Nursing note and vitals reviewed. Constitutional: She is oriented to person, place, and time. She appears well-developed and well-nourished.  HENT:  Head: Normocephalic and atraumatic.  Right Ear: External ear normal.  Left Ear: External ear normal.  Nose: Nose normal.  Mouth/Throat: Oropharynx is clear and moist.  Eyes: Conjunctivae and EOM are normal. Pupils are equal, round, and reactive to light.  Neck: Normal range of motion. Neck supple.  Cardiovascular: Normal rate and normal heart sounds.   Pulmonary/Chest: Effort normal and breath sounds normal.  Abdominal: Soft. Bowel sounds are normal.  Musculoskeletal: Normal range of motion.  Neurological: She is alert and oriented to person, place, and time. She has normal reflexes.  Skin: Skin is warm and dry.  Psychiatric: She has a normal mood and affect.    ED Course  Procedures (including critical care time)  Labs Reviewed  CBC - Abnormal; Notable for the following:    MCHC 36.1 (*)     All other components within normal limits  DIFFERENTIAL  COMPREHENSIVE METABOLIC PANEL   Ct Head Wo Contrast  04/22/2012  *RADIOLOGY REPORT*  Clinical Data: Headache, nausea, left arm  numbness and facial tingling.  CT HEAD WITHOUT CONTRAST  Technique:  Contiguous axial images were obtained from the base of the skull through the vertex without contrast.  Comparison: 06/05/2007 CT and 06/21/2011 MRI.  Findings: No intracranial abnormalities are identified, including mass lesion or mass effect, hydrocephalus, extra-axial fluid collection, midline shift, hemorrhage, or acute infarction.  The visualized bony calvarium is unremarkable.  IMPRESSION: Unremarkable noncontrast head CT.  Original Report Authenticated By: Rosendo Gros, M.D.      1. TIA (transient ischemic attack)     Results for orders placed during the hospital encounter of 04/22/12  CBC      Component Value Range   WBC 9.8  4.0 - 10.5 K/uL   RBC 4.44  3.87 - 5.11 MIL/uL   Hemoglobin 13.3  12.0 - 15.0 g/dL   HCT 96.0  45.4 - 09.8 %   MCV 82.9  78.0 - 100.0 fL   MCH 30.0  26.0 - 34.0 pg   MCHC 36.1 (*) 30.0 - 36.0 g/dL   RDW 11.9  14.7 - 82.9 %   Platelets 249  150 - 400 K/uL  DIFFERENTIAL      Component Value Range   Neutrophils Relative 57  43 - 77 %   Neutro Abs 5.6  1.7 - 7.7 K/uL   Lymphocytes Relative 33  12 - 46 %   Lymphs Abs 3.2  0.7 - 4.0 K/uL   Monocytes Relative 9  3 - 12 %   Monocytes Absolute 0.9  0.1 - 1.0 K/uL   Eosinophils Relative 1  0 - 5 %   Eosinophils Absolute 0.1  0.0 - 0.7 K/uL   Basophils Relative 0  0 - 1 %   Basophils Absolute 0.0  0.0 - 0.1 K/uL  COMPREHENSIVE METABOLIC PANEL      Component Value Range   Sodium 140  135 - 145 mEq/L   Potassium 3.7  3.5 - 5.1 mEq/L   Chloride 106  96 - 112 mEq/L   CO2 24  19 - 32 mEq/L   Glucose, Bld 94  70 - 99 mg/dL   BUN 11  6 - 23 mg/dL   Creatinine, Ser 5.62  0.50 - 1.10 mg/dL   Calcium 9.6  8.4 - 13.0 mg/dL   Total Protein 7.1  6.0 - 8.3 g/dL   Albumin 4.1  3.5 - 5.2 g/dL   AST 15  0 - 37 U/L   ALT 9  0 - 35 U/L   Alkaline Phosphatase 48  39 - 117 U/L   Total Bilirubin 0.4  0.3 - 1.2 mg/dL   GFR calc non Af Amer >90  >90 mL/min   GFR calc Af Amer >90  >90 mL/min  TROPONIN I      Component Value Range   Troponin I <0.30  <0.30 ng/mL   Ct Head Wo Contrast  04/22/2012  *RADIOLOGY REPORT*  Clinical Data: Headache, nausea, left arm numbness and facial tingling.  CT HEAD WITHOUT CONTRAST  Technique:  Contiguous axial images were obtained from the base of the skull through the vertex without contrast.  Comparison: 06/05/2007 CT and 06/21/2011 MRI.  Findings: No intracranial abnormalities are identified, including mass lesion or mass effect, hydrocephalus, extra-axial fluid  collection, midline shift, hemorrhage, or acute infarction.  The visualized bony calvarium is unremarkable.  IMPRESSION: Unremarkable noncontrast head CT.  Original Report Authenticated By: Rosendo Gros, M.D.    Date: 04/22/2012  Rate: 66  Rhythm: normal sinus rhythm  QRS Axis: normal  Intervals: normal  ST/T Wave abnormalities: normal  Conduction Disutrbances:none  Narrative Interpretation:   Old EKG Reviewed: unchanged   MDM    Patient's laboratory evaluations returned and are normal. Patient's head CT is read by the radiologist as normal EKG is normal Dr. Bernette Mayers was in to see and examine the patient. Our plan is to transfer patient to the [facility] for TIA evaluation on the TIA protocol. I spoke to Dr. Aaron Mose  who accepted patient's transfer.      Lonia Skinner [place], [place] 04/22/12 1719

## 2012-04-22 NOTE — ED Notes (Addendum)
Pt reports being in church this am and developing sharp pain in between her shoulder blades, nausea, headache, and numbness running down her left arm.  Pt states that she does not have any pain at this time and that numbness has resolved except her hand is still numb at this time. Pt states that she is no longer nauseated at this time, as well. Pt has a history of slipped disc at the L4-L5 region and is being followed by Dr. Jeral Fruit for the slipped discs. Pt requesting to have blood work done to rule out anything cardiac or neuro for arm numbness and headache.

## 2012-04-22 NOTE — ED Provider Notes (Signed)
History     CSN: [id number]  Arrival date & time 04/22/12  1223   First MD Initiated Contact with Patient 04/22/12 1355      Chief Complaint  Patient presents with  . Nausea  . Numbness    (Consider location/radiation/quality/duration/timing/severity/associated sxs/prior treatment) HPI  History reviewed. No pertinent past medical history.  Past Surgical History  Procedure Date  . Speenoidectomy   . Lasik   . Abdominal hysterectomy     Family History  Problem Relation Age of Onset  . Rheum arthritis Mother   . Diabetes Father   . Hypertension Father     History  Substance Use Topics  . Smoking status: Never Smoker   . Smokeless tobacco: Not on file  . Alcohol Use: No    OB History    Grav Para Term Preterm Abortions TAB SAB Ect Mult Living                  Review of Systems  Allergies  Coppertone spf4; Morphine and related; and Sulfa antibiotics  Home Medications   Current Outpatient Rx  Name Route Sig Dispense Refill  . IBUPROFEN 200 MG PO TABS Oral Take 200 mg by mouth every 6 (six) hours as needed. Patient used this medication for pain.      BP 106/69  Pulse 84  Temp 98.4 F (36.9 C) (Oral)  Resp 16  SpO2 100%  Physical Exam  ED Course  Procedures (including critical care time)  Labs Reviewed  CBC - Abnormal; Notable for the following:    MCHC 36.1 (*)     All other components within normal limits  LIPID PANEL - Abnormal; Notable for the following:    LDL Cholesterol 109 (*)     All other components within normal limits  DIFFERENTIAL  COMPREHENSIVE METABOLIC PANEL  TROPONIN I  CBC  CREATININE, SERUM  HEMOGLOBIN A1C   Ct Head Wo Contrast  04/22/2012  *RADIOLOGY REPORT*  Clinical Data: Headache, nausea, left arm numbness and facial tingling.  CT HEAD WITHOUT CONTRAST  Technique:  Contiguous axial images were obtained from the base of the skull through the vertex without contrast.  Comparison: 06/05/2007 CT and 06/21/2011 MRI.   Findings: No intracranial abnormalities are identified, including mass lesion or mass effect, hydrocephalus, extra-axial fluid collection, midline shift, hemorrhage, or acute infarction.  The visualized bony calvarium is unremarkable.  IMPRESSION: Unremarkable noncontrast head CT.  Original Report Authenticated By: Rosendo Gros, M.D.     1. TIA (transient ischemic attack)    Patient to CDU from [facility], Handoff from [facility]. Patient had headache, L sided numbness and mild weakness of upper extremity lasting several hours. Now resolved completely. Patient on TIA protocol.   Vital signs reviewed and are as follows: Filed Vitals:   04/22/12 1919  BP: 106/69  Pulse: 84  Temp: 98.4 F (36.9 C)  Resp: 16   Exam: Gen NAD; Heart RRR, nml S1,S2, no m/r/g; Lungs CTAB; Abd soft, NT, no rebound or guarding; Ext 2+ pedal pulses bilaterally, no edema; Neuro CN II-XII grossly intact, motor and sensation normal in bilateral upper and lower extremities.   11:36 PM Handoff to Dr. Lafayette Dragon. [place].   Plan: Completion of TIA protocol in AM. Dispo per results.   MDM  TIA protocol. Eval completion in AM.         Renne Crigler, PA 04/22/12 2336

## 2012-04-22 NOTE — ED Provider Notes (Signed)
Medical screening examination/treatment/procedure(s) were conducted as a shared visit with non-physician practitioner(s) and myself.  I personally evaluated the patient during the encounter  Pt with near syncope and headache earlier today which resolved and then this afternoon had severe nausea and L arm numbness, gradually resolving, possible TIA. To [facility] for Obs Protocol.   Nealy Karapetian B. Bernette Mayers, MD 04/22/12 1547

## 2012-04-22 NOTE — ED Notes (Signed)
C/o of feeling lightheaded at church;c/o of nausea Headache/ with left arm numbness that began 30 minutes ago Pt awake and alert; answers questions appropriately

## 2012-04-23 ENCOUNTER — Observation Stay (HOSPITAL_COMMUNITY): Payer: 59

## 2012-04-23 DIAGNOSIS — G459 Transient cerebral ischemic attack, unspecified: Secondary | ICD-10-CM

## 2012-04-23 LAB — HEMOGLOBIN A1C
Hgb A1c MFr Bld: 5.4 % (ref <5.7)
Mean Plasma Glucose: 108 mg/dL (ref <117)

## 2012-04-23 NOTE — Discharge Instructions (Signed)
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 This must be watched closely. Please call and follow up with either Dr. Jeral Fruit or Dr. Phoebe Perch. Your lab work is all normal other than slightly elevated LDL level of 109. Continue to eat healthy, exercise. Follow up with your primary care doctor for recheck and further management. Tylenol for headache.    Cerebral Aneurysm A cerebral aneurysm is the bulging or ballooning out of part of the wall of a vein or artery in the brain. CAUSES Common causes include:   Congenital (present since birth) defects.   High blood pressure.   The build-up of fatty deposits in the arteries (atherosclerosis).   Blood vessels that develop abnormally.   Diseases that cause weakening and damage to the walls of blood vessels.  Uncommon causes include:  Head trauma (damage caused by an accident).   Infection.   Tumors.   Drug abuse (mostly from cocaine, heroin, and amphetamine use).  Cerebral aneurysms can occur at any age. They are more common in adults than in children. They and are slightly more common in women than in men.  SYMPTOMS  The signs and symptoms of an unruptured cerebral aneurysm will partly depend on its size and rate of growth.  A small, unchanging aneurysm will generally produce no symptoms. A larger aneurysm that is steadily growing may produce symptoms such as headache, neck stiffness or pain, loss of feeling in the face or problems with the eyes.  If an aneurysm bursts, the problem can be life-threatening. Symptoms may include:  A sudden and usually severe headache.   Neck stiffness or pain.   Confusion and/or drowsiness.   Problems speaking.   Weakness in an arm and/or a leg.   Nausea (feeling sick to your stomach).   Vision impairment.   Vomiting.   Loss of consciousness.  Rupture of a cerebral aneurysm results in bleeding in the brain, causing a stroke. Or, blood  can leak into the area around the brain and develop into a blood clot within the skull. More problems can occur as a result of the aneurysm breaking. These include:  Re-bleeding.   Hydrocephalus (an increase in normal brain fluid in the chambers inside the brain).   Vasospasm (blood vessels decrease in size and starve the brain of nutrients and oxygen).  TREATMENT  Emergency treatment for a ruptured cerebral aneurysm generally includes restoring breathing, and reducing pressure inside the head. Immediate emergency surgery may be recommended to help prevent damage caused by hydrocephalus and to reduce the risk of re-bleeding.  When aneurysms are discovered before rupture occurs, microcoil thrombosis or balloon embolization may be performed on patients for whom surgery is considered too risky. During these procedures, a thin, hollow tube (catheter) is inserted through an artery to travel up to the brain. Once the catheter reaches the aneurysm, tiny balloons or coils are used to block blood flow through the aneurysm. Other treatments may include:  Bed rest.   Drug therapy.   Hypertensive-hypervolemic therapy (which elevates blood pressure, increases blood volume, and thins the blood) to drive blood flow through and around blocked arteries and control vasospasm.  PROGNOSIS  The prognosis for a patient with a ruptured cerebral aneurysm depends on:  The extent and location of the aneurysm.   The person's age.   General health.   Neurological condition.  Some people with a ruptured cerebral aneurysm die from the initial bleeding. Others recover  with little or no problems. Early diagnosis and treatment are important. Document Released: 07/09/2002 Document Revised: 10/06/2011 Document Reviewed: 09/18/2007 [facility] Patient Information 2012 [place], [place].

## 2012-04-23 NOTE — ED Notes (Signed)
PT GIVEN COPIES OF HER MRI/CT AND RESULTS PER HER REQUEST

## 2012-04-23 NOTE — Progress Notes (Signed)
  Echocardiogram 2D Echocardiogram has been performed.  Stefanie [name] 04/23/2012, 8:46 AM

## 2012-04-23 NOTE — ED Notes (Signed)
Pa in to discuss results of mri

## 2012-04-23 NOTE — ED Provider Notes (Signed)
Medical screening examination/treatment/procedure(s) were performed by non-physician practitioner and as supervising physician I was immediately available for consultation/collaboration.  Cheri Guppy, MD 04/23/12 [phone]

## 2012-04-23 NOTE — ED Provider Notes (Addendum)
Case and MR findings discussed with Dr Phoebe Perch, neurosurgery. Feels that most likely incidental finding and that since symptoms have completely resolved that can follow-up on an outpt basis. Likely continued monitoring imaging. Very strict return precautions discussed.   Raeford Razor, MD 04/23/12 1610  Raeford Razor, MD 04/23/12 [phone]

## 2012-04-23 NOTE — Progress Notes (Signed)
VASCULAR LAB PRELIMINARY  PRELIMINARY  PRELIMINARY  PRELIMINARY  Carotid duplex completed.    Preliminary report:  Bilateral:  No evidence of hemodynamically significant internal carotid artery stenosis.   Vertebral artery flow is antegrade.      Latreshia Beauchaine, RVT 04/23/2012, 8:34 AM

## 2012-04-23 NOTE — ED Notes (Signed)
Patient transported to MRI 

## 2012-04-23 NOTE — ED Provider Notes (Signed)
PT on TIA protocol in CDU.  7:30 AM Seen and examined by me. Pt with acute onset of headache, nausea, dizziness while at church yesterday, associated with left hand and arm tingling. Most symptoms resolved by arrival to ER. Pt was seen at [facility], transferred here for TIA protocol. Pt is currently still symptom free. She is in no distress. She is awaiting MRI and Carotid doppler studies this AM.   8:08 AM Results of MRI discussed with radiologist, Dr. Juleen China and the pt. Dr. Juleen China to consult appropriate consultant.   10:28 AM All results are back. ECHO normal. No significant abnormalities tom her than LDL of 109, which is just mildly elevated, total cholesterol normal. Will d/c pt home with close follow up with neurosurgery. Per Dr. Juleen China who spoke with pt as well, aneurysm must be followed closely.   Pt in no distress  Filed Vitals:   04/23/12 0729  BP: 94/50  Pulse: 64  Temp: 98.7 F (37.1 C)  Resp: 20   Resting comfortably. Lungs clear bilat. Regular Hr and rhythm. Pt states she is having a mild headache, but not the same as the pain that brought her in yesterday, neurovascular function and exam normal.   Lottie Mussel, PA 04/23/12 1031

## 2012-04-26 NOTE — Progress Notes (Signed)
Observation review is complete for 04/22/2012 visit.

## 2012-04-30 NOTE — ED Provider Notes (Signed)
Medical screening examination/treatment/procedure(s) were performed by non-physician practitioner and as supervising physician I was immediately available for consultation/collaboration.  Sheresa Cullop, MD 04/30/12 0045 

## 2012-05-24 DIAGNOSIS — I720 Aneurysm of carotid artery: Secondary | ICD-10-CM | POA: Insufficient documentation

## 2012-08-01 ENCOUNTER — Other Ambulatory Visit (HOSPITAL_COMMUNITY): Payer: Self-pay | Admitting: Internal Medicine

## 2012-08-01 DIAGNOSIS — R200 Anesthesia of skin: Secondary | ICD-10-CM

## 2012-08-01 DIAGNOSIS — M1990 Unspecified osteoarthritis, unspecified site: Secondary | ICD-10-CM

## 2012-08-02 ENCOUNTER — Ambulatory Visit (HOSPITAL_COMMUNITY)
Admission: RE | Admit: 2012-08-02 | Discharge: 2012-08-02 | Disposition: A | Discharge status: Home / Self Care | Payer: 59 | Source: Ambulatory Visit | Attending: Internal Medicine | Admitting: Internal Medicine

## 2012-08-02 DIAGNOSIS — M5030 Other cervical disc degeneration, unspecified cervical region: Secondary | ICD-10-CM | POA: Insufficient documentation

## 2012-08-02 DIAGNOSIS — M1990 Unspecified osteoarthritis, unspecified site: Secondary | ICD-10-CM

## 2012-08-02 DIAGNOSIS — R209 Unspecified disturbances of skin sensation: Secondary | ICD-10-CM | POA: Insufficient documentation

## 2012-08-02 DIAGNOSIS — R29898 Other symptoms and signs involving the musculoskeletal system: Secondary | ICD-10-CM | POA: Insufficient documentation

## 2012-08-02 DIAGNOSIS — R200 Anesthesia of skin: Secondary | ICD-10-CM

## 2012-08-13 DIAGNOSIS — I720 Aneurysm of carotid artery: Secondary | ICD-10-CM | POA: Insufficient documentation

## 2012-12-28 ENCOUNTER — Other Ambulatory Visit: Payer: Self-pay

## 2012-12-28 DIAGNOSIS — Z1231 Encounter for screening mammogram for malignant neoplasm of breast: Secondary | ICD-10-CM

## 2013-01-24 ENCOUNTER — Ambulatory Visit: Payer: 59

## 2013-02-27 ENCOUNTER — Ambulatory Visit
Admission: RE | Admit: 2013-02-27 | Discharge: 2013-02-27 | Disposition: A | Discharge status: Home / Self Care | Payer: 59 | Source: Ambulatory Visit

## 2013-02-27 ENCOUNTER — Ambulatory Visit: Payer: 59

## 2013-02-27 DIAGNOSIS — Z1231 Encounter for screening mammogram for malignant neoplasm of breast: Secondary | ICD-10-CM

## 2013-04-09 ENCOUNTER — Encounter: Payer: Self-pay | Admitting: Emergency Medicine

## 2013-04-09 ENCOUNTER — Ambulatory Visit (HOSPITAL_BASED_OUTPATIENT_CLINIC_OR_DEPARTMENT_OTHER)
Admission: RE | Admit: 2013-04-09 | Discharge: 2013-04-09 | Disposition: A | Discharge status: Home / Self Care | Payer: 59 | Source: Ambulatory Visit | Attending: Family Medicine | Admitting: Family Medicine

## 2013-04-09 ENCOUNTER — Emergency Department
Admission: EM | Admit: 2013-04-09 | Discharge: 2013-04-09 | Disposition: A | Discharge status: Home / Self Care | Payer: 59 | Source: Home / Self Care | Attending: Family Medicine | Admitting: Family Medicine

## 2013-04-09 DIAGNOSIS — R1032 Left lower quadrant pain: Secondary | ICD-10-CM

## 2013-04-09 DIAGNOSIS — Z85820 Personal history of malignant melanoma of skin: Secondary | ICD-10-CM | POA: Insufficient documentation

## 2013-04-09 DIAGNOSIS — K5900 Constipation, unspecified: Secondary | ICD-10-CM

## 2013-04-09 DIAGNOSIS — N83209 Unspecified ovarian cyst, unspecified side: Secondary | ICD-10-CM | POA: Insufficient documentation

## 2013-04-09 DIAGNOSIS — R109 Unspecified abdominal pain: Secondary | ICD-10-CM | POA: Insufficient documentation

## 2013-04-09 DIAGNOSIS — R52 Pain, unspecified: Secondary | ICD-10-CM

## 2013-04-09 DIAGNOSIS — R1031 Right lower quadrant pain: Secondary | ICD-10-CM

## 2013-04-09 HISTORY — DX: Cerebral aneurysm, nonruptured: I67.1

## 2013-04-09 HISTORY — DX: Malignant melanoma of skin, unspecified: C43.9

## 2013-04-09 LAB — COMPLETE METABOLIC PANEL WITH GFR
ALT: 13 U/L (ref 0–35)
AST: 18 U/L (ref 0–37)
Albumin: 4.9 g/dL (ref 3.5–5.2)
Alkaline Phosphatase: 56 U/L (ref 39–117)
BUN: 7 mg/dL (ref 6–23)
CO2: 26 mEq/L (ref 19–32)
Calcium: 9.8 mg/dL (ref 8.4–10.5)
Chloride: 103 mEq/L (ref 96–112)
Creat: 0.69 mg/dL (ref 0.50–1.10)
GFR, Est African American: >89 mL/min
GFR, Est Non African American: >89 mL/min
Glucose, Bld: 91 mg/dL (ref 70–99)
Potassium: 4.3 mEq/L (ref 3.5–5.3)
Sodium: 140 mEq/L (ref 135–145)
Total Bilirubin: 0.3 mg/dL (ref 0.3–1.2)
Total Protein: 7.3 g/dL (ref 6.0–8.3)

## 2013-04-09 LAB — POCT URINALYSIS DIP (MANUAL ENTRY)
Bilirubin, UA: NEGATIVE
Blood, UA: NEGATIVE
Glucose, UA: NEGATIVE
Ketones, POC UA: NEGATIVE
Leukocytes, UA: NEGATIVE
Nitrite, UA: NEGATIVE
Protein Ur, POC: NEGATIVE
Spec Grav, UA: 1.02 (ref 1.005–1.03)
Urobilinogen, UA: 0.2 (ref 0–1)
pH, UA: 7 (ref 5–8)

## 2013-04-09 LAB — POCT CBC W AUTO DIFF (K'VILLE URGENT CARE)

## 2013-04-09 LAB — T3 UPTAKE: T3 Uptake: 25.8 % (ref 22.5–37.0)

## 2013-04-09 LAB — POCT URINALYSIS DIPSTICK

## 2013-04-09 LAB — T4, FREE: Free T4: 1.11 ng/dL (ref 0.80–1.80)

## 2013-04-09 LAB — T3, FREE: T3, Free: 2.9 pg/mL (ref 2.3–4.2)

## 2013-04-09 LAB — TSH: TSH: 3.11 u[IU]/mL (ref 0.350–4.500)

## 2013-04-09 MED ORDER — IOHEXOL 300 MG/ML  SOLN
100.0000 mL | Freq: Once | INTRAMUSCULAR | Status: AC | PRN
  Administered 2013-04-09: 100 mL via INTRAVENOUS

## 2013-04-09 MED ORDER — SENNA-DOCUSATE SODIUM 8.6-50 MG PO TABS: 2.0000 | ORAL_TABLET | Freq: Every day | ORAL | Status: DC

## 2013-04-09 MED ORDER — POLYETHYLENE GLYCOL 3350 17 GM/SCOOP PO POWD: 17.0000 g | Freq: Every day | ORAL | Status: DC

## 2013-04-09 NOTE — ED Notes (Signed)
Reports intermittent abdominal pain for about 2 months. Particularly bad after eating; no nausea/vomiting/diarrhea.

## 2013-04-09 NOTE — ED Provider Notes (Signed)
History     CSN: [id number]  Arrival date & time 04/09/13  0945   First MD Initiated Contact with Patient 04/09/13 1014      Chief Complaint  Patient presents with  . Abdominal Pain       HPI Comments: Patient complains of a two month history of persistent post-prandial sensation of fullness.  She has had anorexia, but no weight loss, for about 3 weeks.  Four days ago she developed lower abdominal post-prandial pain that would last about an hour.  Her pain is worse when sitting/standing, and improved when supine.  She denies nausea/vomiting.  She normally has a bowel movement about every 3 days.  Over the past five days she has had 3 bowel movements with increased amounts of dark mucous.  She now has constant lower abdominal pain that radiates to the groin.  Her pain increases with valsalva and urination, although she does not have any urinary symptoms.  No fevers, chills, and sweats.  No respiratory symptoms. She had a normal colonoscopy three years ago Past history includes resection of melanoma from her right knee one year ago.  She has had a hysterectomy for fibroids.  She has had a brain aneurysm                                                                       Patient is a 47 y.o. female presenting with abdominal pain. The history is provided by the patient and the spouse.  Abdominal Pain This is a new problem. Episode onset: 4 days ago. The problem occurs constantly. The problem has been gradually worsening. Associated symptoms include abdominal pain. Pertinent negatives include no chest pain, no headaches and no shortness of breath. The symptoms are aggravated by walking, eating and standing. Nothing relieves the symptoms. She has tried nothing for the symptoms.    Past Medical History  Diagnosis Date  . Melanoma   . Brain aneurysm     Past Surgical History  Procedure Laterality Date  . Speenoidectomy    . Lasik    . Abdominal hysterectomy      Family History  Problem  Relation Age of Onset  . Rheum arthritis Mother   . Diabetes Father   . Hypertension Father     History  Substance Use Topics  . Smoking status: Never Smoker   . Smokeless tobacco: Not on file  . Alcohol Use: No    OB History   Grav Para Term Preterm Abortions TAB SAB Ect Mult Living                  Review of Systems  Respiratory: Negative for shortness of breath.   Cardiovascular: Negative for chest pain.  Gastrointestinal: Positive for abdominal pain.  Neurological: Negative for headaches.  All other systems reviewed and are negative.    Allergies  Coppertone spf4; Morphine and related; and Sulfa antibiotics  Home Medications   Current Outpatient Rx  Name  Route  Sig  Dispense  Refill  . ibuprofen (ADVIL,MOTRIN) 200 MG tablet   Oral   Take 200 mg by mouth every 6 (six) hours as needed. Patient used this medication for pain.         . polyethylene glycol  powder (MIRALAX) powder   Oral   Take 17 g by mouth daily. Mix in 8 oz of liquid.   255 g   0   . sennosides-docusate sodium (SENOKOT-S) 8.6-50 MG tablet   Oral   Take 2 tablets by mouth daily. Take at bedtime   20 tablet   1     BP 100/63  Pulse 61  Temp(Src) 98.3 F (36.8 C) (Oral)  Resp 16  Ht 5\' 2"  (1.575 m)  Wt 125 lb (56.7 kg)  BMI 22.86 kg/m2  SpO2 97%  Physical Exam Nursing notes and Vital Signs reviewed. Appearance:  Patient appears healthy, stated age, and in no acute distress Eyes:  Pupils are equal, round, and reactive to light and accomodation.  Extraocular movement is intact.  Conjunctivae are not inflamed  Nose:  Normal turbinates.   Mouth/Pharynx:  Normal Neck:  Supple.   No adenopathy or thyromegaly Lungs:  Clear to auscultation.  Breath sounds are equal.  Heart:  Regular rate and rhythm without murmurs, rubs, or gallops.  Abdomen:   Diffuse bilateral tenderness in lower quadrants without masses or hepatosplenomegaly.  Bowel sounds are present.  Right flank tenderness is  present.  Negative iliopsoas and obdurator tests.  Extremities:  No edema.  No calf tenderness Skin:  No rash present.   ED Course  Procedures    Labs Reviewed  COMPLETE METABOLIC PANEL WITH GFR normal  POCT CBC W AUTO DIFF ([facility]) WBC 7.7; LY 38.3; MO 8.7; GR 53.0; Hgb 14.9; Platelets 265     POCT URINALYSIS DIP (MANUAL ENTRY) Normal   Ct Abdomen Pelvis W Contrast  04/09/2013   *RADIOLOGY REPORT*  Clinical Data: Worsening abdominal and pelvic pain.  Personal history of melanoma.  CT ABDOMEN AND PELVIS WITH CONTRAST  Technique:  Multidetector CT imaging of the abdomen and pelvis was performed following the standard protocol during bolus administration of intravenous contrast.  Contrast: OMNIPAQUE IOHEXOL 300 MG/ML  SOLN  Comparison: 08/25/2009  Findings: The liver, gallbladder, pancreas, spleen, adrenal glands, and kidneys are normal appearance.  No evidence of hydronephrosis.  No soft tissue masses or lymphadenopathy identified within the abdomen or pelvis.  Prior hysterectomy noted. 2 cm benign appearing cyst or follicle noted in the left ovary.  No evidence of inflammatory process or abnormal fluid collections.  No evidence of bowel wall thickening or obstruction. No evidence of hernia. Moderate to large stool burden again seen throughout majority of the colon, without evidence of obstruction.  IMPRESSION:  1.  2 cm benign appearing left ovarian cyst or follicle. 2.  No significant change in moderate to large colonic stool burden; suggest clinical correlation for possible constipation. 3.  No evidence of abdominal or pelvic metastatic disease.   Original Report Authenticated By: Myles Rosenthal, M.D.     1. Abdominal pain, acute, bilateral lower quadrant   2. Unspecified constipation       MDM  Check TSH and thyroid profile. Begin daily dose of Miralax, and Sennokot at bedtime. Increase fluid intake.  Increase dietary fiber as constipation improves. Followup with Family  Doctor in one week.         Lattie Haw, MD 04/09/13 [phone]

## 2013-04-11 ENCOUNTER — Telehealth: Payer: Self-pay | Admitting: *Deleted

## 2013-06-21 ENCOUNTER — Other Ambulatory Visit (HOSPITAL_COMMUNITY): Payer: Self-pay | Admitting: Neurosurgery

## 2013-06-21 ENCOUNTER — Ambulatory Visit (HOSPITAL_COMMUNITY)
Admission: RE | Admit: 2013-06-21 | Discharge: 2013-06-21 | Disposition: A | Discharge status: Home / Self Care | Payer: 59 | Source: Ambulatory Visit | Attending: Neurosurgery | Admitting: Neurosurgery

## 2013-06-21 ENCOUNTER — Inpatient Hospital Stay
Admission: RE | Admit: 2013-06-21 | Discharge: 2013-06-21 | Disposition: A | Discharge status: Home / Self Care | Payer: Self-pay | Source: Ambulatory Visit | Attending: Neurosurgery | Admitting: Neurosurgery

## 2013-06-21 DIAGNOSIS — I720 Aneurysm of carotid artery: Secondary | ICD-10-CM

## 2013-06-21 DIAGNOSIS — I671 Cerebral aneurysm, nonruptured: Secondary | ICD-10-CM

## 2013-06-24 ENCOUNTER — Other Ambulatory Visit: Payer: Self-pay | Admitting: Obstetrics & Gynecology

## 2013-06-24 DIAGNOSIS — N644 Mastodynia: Secondary | ICD-10-CM

## 2013-06-26 ENCOUNTER — Ambulatory Visit
Admission: RE | Admit: 2013-06-26 | Discharge: 2013-06-26 | Disposition: A | Discharge status: Home / Self Care | Payer: 59 | Source: Ambulatory Visit | Attending: Obstetrics & Gynecology | Admitting: Obstetrics & Gynecology

## 2013-06-26 ENCOUNTER — Other Ambulatory Visit: Payer: Self-pay | Admitting: Obstetrics & Gynecology

## 2013-06-26 DIAGNOSIS — N644 Mastodynia: Secondary | ICD-10-CM

## 2013-07-15 ENCOUNTER — Ambulatory Visit
Admission: RE | Admit: 2013-07-15 | Discharge: 2013-07-15 | Disposition: A | Discharge status: Home / Self Care | Payer: 59 | Source: Ambulatory Visit | Attending: Obstetrics & Gynecology | Admitting: Obstetrics & Gynecology

## 2013-07-15 DIAGNOSIS — N644 Mastodynia: Secondary | ICD-10-CM

## 2013-10-01 ENCOUNTER — Encounter (HOSPITAL_COMMUNITY): Payer: Self-pay | Admitting: Emergency Medicine

## 2013-10-01 ENCOUNTER — Inpatient Hospital Stay (HOSPITAL_COMMUNITY)
Admission: EM | Admit: 2013-10-01 | Discharge: 2013-10-02 | DRG: 312 | Disposition: A | Discharge status: Home / Self Care | Payer: 59 | Attending: Internal Medicine | Admitting: Internal Medicine

## 2013-10-01 ENCOUNTER — Emergency Department (HOSPITAL_COMMUNITY): Payer: 59

## 2013-10-01 DIAGNOSIS — R42 Dizziness and giddiness: Secondary | ICD-10-CM | POA: Diagnosis present

## 2013-10-01 DIAGNOSIS — I671 Cerebral aneurysm, nonruptured: Secondary | ICD-10-CM | POA: Diagnosis present

## 2013-10-01 DIAGNOSIS — R51 Headache: Secondary | ICD-10-CM | POA: Diagnosis present

## 2013-10-01 DIAGNOSIS — Z85820 Personal history of malignant melanoma of skin: Secondary | ICD-10-CM

## 2013-10-01 DIAGNOSIS — Z833 Family history of diabetes mellitus: Secondary | ICD-10-CM

## 2013-10-01 DIAGNOSIS — R0609 Other forms of dyspnea: Secondary | ICD-10-CM | POA: Diagnosis present

## 2013-10-01 DIAGNOSIS — R55 Syncope and collapse: Principal | ICD-10-CM | POA: Diagnosis present

## 2013-10-01 DIAGNOSIS — R29898 Other symptoms and signs involving the musculoskeletal system: Secondary | ICD-10-CM | POA: Diagnosis present

## 2013-10-01 DIAGNOSIS — R0989 Other specified symptoms and signs involving the circulatory and respiratory systems: Secondary | ICD-10-CM | POA: Diagnosis present

## 2013-10-01 DIAGNOSIS — Z8249 Family history of ischemic heart disease and other diseases of the circulatory system: Secondary | ICD-10-CM

## 2013-10-01 DIAGNOSIS — R209 Unspecified disturbances of skin sensation: Secondary | ICD-10-CM | POA: Diagnosis present

## 2013-10-01 LAB — CBC WITH DIFFERENTIAL/PLATELET
Basophils Absolute: 0 10*3/uL (ref 0.0–0.1)
Basophils Relative: 0 % (ref 0–1)
Eosinophils Absolute: 0.1 10*3/uL (ref 0.0–0.7)
Eosinophils Relative: 1 % (ref 0–5)
HCT: 39.3 % (ref 36.0–46.0)
Hemoglobin: 14 g/dL (ref 12.0–15.0)
Lymphocytes Relative: 46 % (ref 12–46)
Lymphs Abs: 3.5 10*3/uL (ref 0.7–4.0)
MCH: 30.2 pg (ref 26.0–34.0)
MCHC: 35.6 g/dL (ref 30.0–36.0)
MCV: 84.7 fL (ref 78.0–100.0)
Monocytes Absolute: 0.7 10*3/uL (ref 0.1–1.0)
Monocytes Relative: 9 % (ref 3–12)
Neutro Abs: 3.4 10*3/uL (ref 1.7–7.7)
Neutrophils Relative %: 44 % (ref 43–77)
Platelets: 268 10*3/uL (ref 150–400)
RBC: 4.64 MIL/uL (ref 3.87–5.11)
RDW: 12.7 % (ref 11.5–15.5)
WBC: 7.6 10*3/uL (ref 4.0–10.5)

## 2013-10-01 LAB — BASIC METABOLIC PANEL
BUN: 11 mg/dL (ref 6–23)
CO2: 24 mEq/L (ref 19–32)
Calcium: 9.7 mg/dL (ref 8.4–10.5)
Chloride: 101 mEq/L (ref 96–112)
Creatinine, Ser: 0.62 mg/dL (ref 0.50–1.10)
GFR calc Af Amer: >90 mL/min (ref >90)
GFR calc non Af Amer: >90 mL/min (ref >90)
Glucose, Bld: 94 mg/dL (ref 70–99)
Potassium: 3.8 mEq/L (ref 3.5–5.1)
Sodium: 136 mEq/L (ref 135–145)

## 2013-10-01 LAB — MAGNESIUM: Magnesium: 2.4 mg/dL (ref 1.5–2.5)

## 2013-10-01 LAB — POCT I-STAT TROPONIN I: Troponin i, poc: 0 ng/mL (ref 0.00–0.08)

## 2013-10-01 LAB — TROPONIN I: Troponin I: <0.3 ng/mL

## 2013-10-01 MED ORDER — ONDANSETRON HCL 4 MG/2ML IJ SOLN: 4.0000 mg | Freq: Four times a day (QID) | INTRAMUSCULAR | Status: DC | PRN

## 2013-10-01 MED ORDER — ACETAMINOPHEN 325 MG PO TABS: 650.0000 mg | ORAL_TABLET | Freq: Four times a day (QID) | ORAL | Status: DC | PRN

## 2013-10-01 NOTE — H&P (Signed)
[facility] History and Physical  Stefanie [name] OZH:[id number] DOB: 1966/04/22 DOA: 10/01/2013  Referring physician: EDP PCP: Lillia Mountain, MD  Specialists: [facility]  Chief Complaint: lightheaded  HPI: Stefanie [name] is a 47 y.o. 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 She was sitting at her desk today when suddenly she had a strange feeling across her chest and feeling something stopped, this was followed by lightheadedness flushing and sweating. She was then brought to [facility] long emergency room after consulting with M.D. at [facility]. After this she had some transient numbness and weakness in her left arm which has since resolved. For this past week she's had 3-4 episodes per day where she had this for a strange feeling across her chest, with transient difficulty breathing which was self-limiting, and she sought no medical attention for this. In the ER her vitals, and preliminary workup unremarkable including CT head and TRH was consulted for further evaluation and management.     Review of Systems: The patient denies anorexia, fever, weight loss,, vision loss, decreased hearing, hoarseness, chest pain, syncope, dyspnea on exertion, peripheral edema, balance deficits, hemoptysis, abdominal pain, melena, hematochezia, severe indigestion/heartburn, hematuria, incontinence, genital sores, muscle weakness, suspicious skin lesions, transient blindness, difficulty walking, depression, unusual weight change, abnormal bleeding, enlarged lymph nodes, angioedema, and breast masses.    Past Medical History  Diagnosis Date  . Melanoma   . Brain aneurysm    Past Surgical History  Procedure Laterality Date   . Speenoidectomy    . Lasik    . Abdominal hysterectomy     Social History:  reports that she has never smoked. She has never used smokeless tobacco. She reports that she does not drink alcohol or use illicit drugs. Lives at home with her husband, has 2 children  Allergies  Allergen Reactions  . Morphine And Related Hives and Itching  . Coppertone Spf4 Itching  . Sulfa Antibiotics Hives and Itching    headaches    Family History  Problem Relation Age of Onset  . Rheum arthritis Mother   . Diabetes Father   . Hypertension Father     Prior to Admission medications   Not on File   Physical Exam: Filed Vitals:   10/01/13 2019  BP:   Pulse: 59  Temp:   Resp: 14     General: Alert awake oriented x3 no distress, anxious appearing  HEENT: PERRLA, EOMI  CVS S1-S2 regular rate rhythm no rubs or gallops  Lungs clear auscultation bilaterally  Abdomen soft nontender with normal bowel sounds no organomegaly  Extremities no edema clubbing or cyanosis, erythema of both palms, left greater than right  Skin no rashes  Psychiatric appropriate mood and affect  Neuro: Cranial nerve 2 to 12 grossly intact  Motor 5 x 5 in all extremities, sensory light touch intact  DTRs 2-3+ bilaterally  Plantars withdrawal    Labs on Admission:  Basic Metabolic Panel:  Recent Labs Lab 10/01/13 1835  NA 136  K 3.8  CL 101  CO2 24  GLUCOSE 94  BUN 11  CREATININE 0.62  CALCIUM 9.7  MG 2.4   Liver Function Tests: No results found for this basename: AST, ALT, ALKPHOS,  BILITOT, PROT, ALBUMIN,  in the last 168 hours No results found for this basename: LIPASE, AMYLASE,  in the last 168 hours No results found for this basename: AMMONIA,  in the last 168 hours CBC:  Recent Labs Lab 10/01/13 1835  WBC 7.6  NEUTROABS 3.4  HGB 14.0  HCT 39.3  MCV 84.7  PLT 268   Cardiac Enzymes: No results found for this basename: CKTOTAL, CKMB, CKMBINDEX, TROPONINI,  in the last 168  hours  BNP (last 3 results) No results found for this basename: PROBNP,  in the last 8760 hours CBG: No results found for this basename: GLUCAP,  in the last 168 hours  Radiological Exams on Admission: Ct Head Wo Contrast  10/01/2013   CLINICAL DATA:  Headaches, protocol, elevated blood pressure. Aneurysm seen on prior MRA of the head.  EXAM: CT HEAD WITHOUT CONTRAST  TECHNIQUE: Contiguous axial images were obtained from the base of the skull through the vertex without intravenous contrast.  COMPARISON:  MRA of the head June 21, 2013  FINDINGS: There is no midline shift, hydrocephalus, or mass. No acute hemorrhage or acute transcortical infarct is identified. The bony calvarium is intact. The visualized sinuses are clear.  IMPRESSION: No focal acute intracranial abnormality identified. Specifically no acute hemorrhage noted.   Electronically Signed   By: Sherian Rein M.D.   On: 10/01/2013 19:17    EKG: Independently reviewed. Sinus rhythm, baseline wander in inferior leads  Assessment/Plan  1. Presyncope -Differential includes arrhythmias versus aneurysm related -CT head normal, -will check MRI brain, given history of intracranial aneurysms -Monitor on telemetry -Check cardiac enzymes, TSH/T4 -2D. Echocardiogram -Will probably need Event monitor is if above workup is unrevealing  2. history of intracranial aneurysms  -As above    Code Status: Full Code Family Communication: none at bedside Disposition Plan: telemetry  Time spent:  [facility] Pager [phone]  If 7PM-7AM, please contact night-coverage [email or web address] Password [facility] 10/01/2013, 8:45 PM

## 2013-10-01 NOTE — ED Provider Notes (Addendum)
CSN: [id number]     Arrival date & time 10/01/13  1740 History   First MD Initiated Contact with Patient 10/01/13 1810     Chief Complaint  Patient presents with  . Near Syncope  . Numbness    HPI  Patient's her with a near syncopal episode in her office. 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 She was sitting at rest talking to employee for approximately 30 minutes. She began to feel strange feeling in her chest. She felt like everything in her chest "stopped". She felt lightheaded and near syncopal. She felt hot and flushed. She does not know she was diaphoretic. She had no pain in her arm. She then felt a bit of a fluttering. The next 20-30 minutes she continued to feel lightheaded and dizzy. Her left arm felt tingly. She did not have headache then. She developed a mild headache in the emergency room. She has a history of known ICA aneurysms from previous MRI for evaluation for headache 2 years ago. No history of rupture. No history of strokes. No history of hypertension diabetes, arrhythmias, heart disease. Her father died of a heart attack. Her left arm still feels tingly and painful from the elbow down. She has normal use of it.  She describes other episodes during the week the sensation of something in her chest and occasional tachycardia palpitations. These are fleeting and lasted only a few seconds at a time. It sounds somewhat like PVCs. That concerned the point that she was considering going to see her physician but has not.  She walks four flights of  stairs to get to work. She is noted this week and she is little more limited with her exertional capacity. However, she does not get chest pain or shortness of breath or tightness with this.  Past Medical History  Diagnosis Date  . Melanoma   . Brain aneurysm    Past Surgical History  Procedure Laterality Date  . Speenoidectomy    . Lasik    . Abdominal hysterectomy     Family History  Problem Relation Age of Onset  .  Rheum arthritis Mother   . Diabetes Father   . Hypertension Father    History  Substance Use Topics  . Smoking status: Never Smoker   . Smokeless tobacco: Not on file  . Alcohol Use: No   OB History   Grav Para Term Preterm Abortions TAB SAB Ect Mult Living                 Review of Systems  Constitutional: Negative for fever, chills, diaphoresis, appetite change and fatigue.  HENT: Negative for mouth sores, sore throat and trouble swallowing.   Eyes: Negative for visual disturbance.  Respiratory: Positive for chest tightness and shortness of breath. Negative for cough and wheezing.   Cardiovascular: Positive for palpitations. Negative for chest pain.       Near-syncopal  Gastrointestinal: Negative for nausea, vomiting, abdominal pain, diarrhea and abdominal distention.  Endocrine: Negative for polydipsia, polyphagia and polyuria.  Genitourinary: Negative for dysuria, frequency and hematuria.  Musculoskeletal: Negative for gait problem.  Skin: Negative for color change, pallor and rash.  Neurological: Positive for light-headedness. Negative for dizziness, syncope and headaches.  Hematological: Does not bruise/bleed easily.  Psychiatric/Behavioral: Negative for behavioral problems and confusion.    Allergies  Morphine and related; Coppertone spf4; and Sulfa antibiotics  Home Medications  No current outpatient prescriptions on file. BP 134/72  Pulse 59  Temp(Src) 97.9  F (36.6 C) (Oral)  SpO2 100% Physical Exam  Constitutional: She is oriented to person, place, and time. She appears well-developed and well-nourished. No distress.  HENT:  Head: Normocephalic.  Eyes: Conjunctivae are normal. Pupils are equal, round, and reactive to light. No scleral icterus.  Neck: Normal range of motion. Neck supple. No thyromegaly present.  Cardiovascular: Normal rate, regular rhythm, S1 normal and S2 normal.  Exam reveals no gallop and no friction rub.   No murmur heard. No murmurs.  Sinus rhythm. No ectopy.  Pulmonary/Chest: Effort normal and breath sounds normal. No respiratory distress. She has no wheezes. She has no rales.  Abdominal: Soft. Bowel sounds are normal. She exhibits no distension. There is no tenderness. There is no rebound.  Musculoskeletal: Normal range of motion.  Neurological: She is alert and oriented to person, place, and time.  Skin: Skin is warm and dry. No rash noted.  Psychiatric: She has a normal mood and affect. Her behavior is normal.    ED Course  Procedures (including critical care time) Labs Review Labs Reviewed  CBC WITH DIFFERENTIAL  BASIC METABOLIC PANEL  MAGNESIUM  TSH  POCT I-STAT TROPONIN I   Imaging Review Ct Head Wo Contrast  10/01/2013   CLINICAL DATA:  Headaches, protocol, elevated blood pressure. Aneurysm seen on prior MRA of the head.  EXAM: CT HEAD WITHOUT CONTRAST  TECHNIQUE: Contiguous axial images were obtained from the base of the skull through the vertex without intravenous contrast.  COMPARISON:  MRA of the head June 21, 2013  FINDINGS: There is no midline shift, hydrocephalus, or mass. No acute hemorrhage or acute transcortical infarct is identified. The bony calvarium is intact. The visualized sinuses are clear.  IMPRESSION: No focal acute intracranial abnormality identified. Specifically no acute hemorrhage noted.   Electronically Signed   By: Sherian Rein M.D.   On: 10/01/2013 19:17    EKG Interpretation    Date/Time:  Tuesday October 01 2013 17:58:40 EST Ventricular Rate:  63 PR Interval:  124 QRS Duration: 102 QT Interval:  419 QTC Calculation: 429 R Axis:   35 Text Interpretation:  Sinus rhythm Baseline wander in lead(s) II III aVF V3 Confirmed by Fayrene Fearing  MD, Jonathandavid Marlett ([id number]) on 10/01/2013 8:01:46 PM            MDM   1. Syncope    Her evaluation here is normal she is a normal EKG. Troponin is normal. No ectopy in the department. Normal electrolytes. She's had symptoms for weeks or crescendoing  over the last few days and that today with syncope. Should be admitted. Care discussed with try hospitalist be admitted to a telemetry bed.    Roney Marion, MD 10/01/13 2002  Roney Marion, MD 10/01/13 2040

## 2013-10-01 NOTE — ED Notes (Signed)
Pt now c/o increased pressure in her head. MD at bedside to talk with patient at this time.

## 2013-10-01 NOTE — ED Notes (Signed)
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 Pt now has chest discomfort, HA, L arm numbness and pain. Hx of two brain aneurysms. Alert and oriented at this time. Dr. Fayrene Fearing notified of pt.

## 2013-10-02 DIAGNOSIS — R55 Syncope and collapse: Secondary | ICD-10-CM | POA: Diagnosis present

## 2013-10-02 LAB — CBC
HCT: 39.2 % (ref 36.0–46.0)
Hemoglobin: 13.7 g/dL (ref 12.0–15.0)
MCH: 29.7 pg (ref 26.0–34.0)
MCHC: 34.9 g/dL (ref 30.0–36.0)
MCV: 84.8 fL (ref 78.0–100.0)
Platelets: 241 10*3/uL (ref 150–400)
RBC: 4.62 MIL/uL (ref 3.87–5.11)
RDW: 12.8 % (ref 11.5–15.5)
WBC: 8 10*3/uL (ref 4.0–10.5)

## 2013-10-02 LAB — BASIC METABOLIC PANEL
BUN: 11 mg/dL (ref 6–23)
CO2: 23 mEq/L (ref 19–32)
Calcium: 9 mg/dL (ref 8.4–10.5)
Chloride: 103 mEq/L (ref 96–112)
Creatinine, Ser: 0.73 mg/dL (ref 0.50–1.10)
GFR calc Af Amer: >90 mL/min (ref >90)
GFR calc non Af Amer: >90 mL/min (ref >90)
Glucose, Bld: 94 mg/dL (ref 70–99)
Potassium: 3.5 mEq/L (ref 3.5–5.1)
Sodium: 137 mEq/L (ref 135–145)

## 2013-10-02 LAB — T4, FREE: Free T4: 1.02 ng/dL (ref 0.80–1.80)

## 2013-10-02 LAB — TSH: TSH: 3.55 u[IU]/mL (ref 0.350–4.500)

## 2013-10-02 LAB — TROPONIN I: Troponin I: <0.3 ng/mL (ref <0.30)

## 2013-10-02 NOTE — Progress Notes (Signed)
Pt ambulated per MD order. Pt c/o of some dizziness but states she did not feel like she was going to "pass out." Pt reported that the dizziness improved the longer she was up and moving around. Pt also c/o a mild headache 3/10 which she has frequently. MD made aware of findings and of orthostatic vital signs obtained this am. MD stated it was ok to d/c pt to home and to set up a f/u appointment with Dr. Excell Seltzer per pt request. Appointment made and information placed on AVS for pt. IV d/ced and pt will be provided with discharge papers when they are completed by the MD.  [facility] 10/02/2013 10:34 AM

## 2013-10-02 NOTE — Progress Notes (Signed)
Subjective: Still dizzy  Objective: Vital signs in last 24 hours: Temp:  [97.9 F (36.6 C)-98.4 F (36.9 C)] 98.3 F (36.8 C) (12/03 0556) Pulse Rate:  [52-59] 54 (12/03 0556) Resp:  [14-16] 16 (12/03 0556) BP: (104-134)/(57-75) 104/57 mmHg (12/03 0556) SpO2:  [98 %-100 %] 98 % (12/03 0556) Weight:  [56.7 kg (125 lb)] 56.7 kg (125 lb) (12/02 2053) Weight change:  Last BM Date: 09/29/13  Intake/Output from previous day:   Intake/Output this shift:    Resp: clear to auscultation bilaterally Cardio: regular rate and rhythm, S1, S2 normal, no murmur, click, rub or gallop Extremities: extremities normal, atraumatic, no cyanosis or edema  Lab Results:  Recent Labs  10/01/13 1835 10/02/13 0400  WBC 7.6 8.0  HGB 14.0 13.7  HCT 39.3 39.2  PLT 268 241   BMET  Recent Labs  10/01/13 1835 10/02/13 0400  NA 136 137  K 3.8 3.5  CL 101 103  CO2 24 23  GLUCOSE 94 94  BUN 11 11  CREATININE 0.62 0.73  CALCIUM 9.7 9.0    Studies/Results: Ct Head Wo Contrast  10/01/2013   CLINICAL DATA:  Headaches, protocol, elevated blood pressure. Aneurysm seen on prior MRA of the head.  EXAM: CT HEAD WITHOUT CONTRAST  TECHNIQUE: Contiguous axial images were obtained from the base of the skull through the vertex without intravenous contrast.  COMPARISON:  MRA of the head June 21, 2013  FINDINGS: There is no midline shift, hydrocephalus, or mass. No acute hemorrhage or acute transcortical infarct is identified. The bony calvarium is intact. The visualized sinuses are clear.  IMPRESSION: No focal acute intracranial abnormality identified. Specifically no acute hemorrhage noted.   Electronically Signed   By: Sherian Rein M.D.   On: 10/01/2013 19:17    Medications: I have reviewed the patient's current medications.  Assessment/Plan: Principal Problem:   PreSyncope etiology unclear, tele normal so far, check orthostatics, echo normal in 2013, will not repeat.  Cancel MRI, very small ICAs, no  SAH, no need to repeat.  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

## 2013-10-02 NOTE — Care Management Note (Signed)
    Page 1 of 1   10/02/2013     10:10:42 AM   CARE MANAGEMENT NOTE 10/02/2013  Patient:  Stefanie [name], Stefanie [name]   Account Number:  [id number]  Date Initiated:  10/02/2013  Documentation initiated by:  [facility]  Subjective/Objective Assessment:   47 Y/O F ADMITTED W/[id number].     Action/Plan:   FROM HOME.HAS PCP,PHARMACY.   Anticipated DC Date:  10/03/2013   Anticipated DC Plan:  HOME/SELF CARE      DC Planning Services  CM consult      Choice offered to / List presented to:             Status of service:  Completed, signed off Medicare Important Message given?   (If response is "NO", the following Medicare IM given date fields will be blank) Date Medicare IM given:   Date Additional Medicare IM given:    Discharge Disposition:    Per UR Regulation:  Reviewed for med. necessity/level of care/duration of stay  If discussed at Long Length of Stay Meetings, dates discussed:    Comments:  10/02/13 [facility] RN,BSN NCM [phone]

## 2013-10-03 NOTE — Discharge Summary (Signed)
Physician Discharge Summary  Patient ID: Mallerie Blok Mena MRN: [id number] DOB/AGE: 47-02-1966 47 y.o.  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 She had several milder episodes earlier in the week. In the ER her basic metabolic profile and CBC were unremarkable. She is known intracranial aneurysms, CT scan of the brain showed no evidence of acute hemorrhage. EKG normal sinus rhythm. The patient was admitted and placed on telemetry. She had normal sinus rhythm/sinus bradycardia on her telemetry but no evidence of arrhythmia. She had no further symptoms of presyncope and was able to ambulate prior to discharge. A cardiology followup was arranged.  Consults: None  Significant Diagnostic Studies: labs: Normal CBC and basic metabolic profile and radiology: CT scan: Brain, normal  Treatments: IV hydration  Discharge Exam: Blood pressure 104/62, pulse 79, temperature 98.3 F (36.8 C), temperature source Oral, resp. rate 16, height 5\' 2"  (1.575 m), weight 56.7 kg (125 lb), SpO2 100.00%. Resp: clear to auscultation bilaterally Cardio: regular rate and rhythm, S1, S2 normal, no murmur, click, rub or gallop  Disposition: 01-Home or Self Care   Future Appointments Provider Department Dept Phone   11/07/2013 9:00 AM Tonny Bollman, MD [facility] Office [phone]       Medication List    Notice   You have not been prescribed any medications.         Follow-up Information   Follow up with Tonny Bollman, MD On 11/07/2013. (at 9am)    Specialty:  Cardiology   Contact information:   [address] [place] [address] [phone]       Follow up with Lillia Mountain, MD In 1 week. (would like to check on patient in one week as cardiology appointment is not for over one month)    Specialty:  Internal Medicine   Contact information:   [address], [address] [place] [address] [phone]       Signed: Lillia Mountain 10/03/2013, 7:44 AM

## 2013-11-05 ENCOUNTER — Ambulatory Visit (INDEPENDENT_AMBULATORY_CARE_PROVIDER_SITE_OTHER): Payer: 59 | Admitting: Cardiovascular Disease

## 2013-11-05 ENCOUNTER — Encounter: Payer: Self-pay | Admitting: Cardiovascular Disease

## 2013-11-05 VITALS — BP 110/64 | HR 47 | Ht 62.0 in | Wt 124.0 lb

## 2013-11-05 DIAGNOSIS — R55 Syncope and collapse: Secondary | ICD-10-CM

## 2013-11-05 DIAGNOSIS — R002 Palpitations: Secondary | ICD-10-CM

## 2013-11-05 NOTE — Patient Instructions (Addendum)
Your physician has recommended that you wear a holter monitor. Holter monitors are medical devices that record the heart's electrical activity. Doctors most often use these monitors to diagnose arrhythmias. Arrhythmias are problems with the speed or rhythm of the heartbeat. The monitor is a small, portable device. You can wear one while you do your normal daily activities. This is usually used to diagnose what is causing palpitations/syncope (passing out).  Your physician wants you to follow-up in: 1 year with Dr. Burt Knack.  You will receive a reminder letter in the mail two months in advance. If you don't receive a letter, please call our office to schedule the follow-up appointment.

## 2013-11-05 NOTE — Progress Notes (Signed)
HPI:  48 year old woman presenting for initial cardiac evaluation.  The patient was hospitalized overnight on December 2 for evaluation of near syncope. She works at the [employer] center. She was having a discussion with one of her colleagues when she felt sudden onset of heart palpitations. This felt like her "heart stopped." She became suddenly lightheaded and short of breath. The episode was brief in nature. Her blood pressure , which normally runs less than 110/70, increased to 145/85. She became concerned about this elevation in blood pressure because of diagnosis of cerebral aneurysm. She was evaluated by one of the physicians at the [facility] who recommended that she go to the emergency room. The patient was observed for 24 hours and there was nothing revealing on her telemetry or basic lab testing. She was discharged home the following day.  She had another episode of near syncope in 2013 when she was at church. She had been sitting during the service and upon standing became acutely lightheaded. There were associated symptoms of diaphoresis and nausea. Symptoms resolved after sitting down.  The patient complains of increasing heart palpitations over the past several months. She has not had any specific changes in her lifestyle. She drinks caffeine, primarily tea. She does drink plenty of water. She does not use any stimulant medications or herbals at the present time. Palpitations are most noticeable at nighttime when she is at rest. There is associated shortness of breath. She has not had frank syncope. She has no exertional chest pain or pressure.   Additional symptoms include feet swelling, pruritus, near constant and pressure, daily headaches.  The patient went through menopause at age 83. She has no history of hyperlipidemia. Coronary artery disease he has been in her family. Her father had an MI at age 48 and was treated with 5 vessel CABG. She has a brother who had his first MI around  age 52. Her mother died at age 44 of complications of rheumatoid arthritis. The patient is a lifetime nonsmoker.  Outpatient Encounter Prescriptions as of 11/05/2013  Medication Sig  . acetaminophen (TYLENOL) 500 MG tablet Take 500 mg by mouth every 6 (six) hours as needed.    Morphine and related; Coppertone spf4; and Sulfa antibiotics  Past Medical History  Diagnosis Date  . Melanoma   . Brain aneurysm     Past Surgical History  Procedure Laterality Date  . Speenoidectomy    . Lasik    . Abdominal hysterectomy      History   Social History  . Marital Status: Married    Spouse Name: N/A    Number of Children: N/A  . Years of Education: N/A   Occupational History  . Not on file.   Social History Main Topics  . Smoking status: Never Smoker   . Smokeless tobacco: Never Used  . Alcohol Use: No  . Drug Use: No  . Sexual Activity: Not on file   Other Topics Concern  . Not on file   Social History Narrative  . No narrative on file    Family History  Problem Relation Age of Onset  . Rheum arthritis Mother   . Diabetes Father   . Hypertension Father     ROS:  General: no fevers/chills/night sweats Eyes: no blurry vision, diplopia, or amaurosis ENT: no sore throat or hearing loss Resp: no cough, wheezing, or hemoptysis CV: See history of present illness GI: no abdominal pain, nausea, vomiting, diarrhea, or constipation GU: no dysuria, frequency, or  hematuria Skin: no rash Neuro: See history of present illness Musculoskeletal: no joint pain or swelling Heme: no bleeding, DVT, or easy bruising Endo: no polydipsia or polyuria  BP 110/64  Pulse 47  Ht 5\' 2"  (1.575 m)  Wt 124 lb (56.246 kg)  BMI 22.67 kg/m2  PHYSICAL EXAM: Pt is alert and oriented, WD, WN, in no distress. HEENT: normal Neck: JVP normal. Carotid upstrokes normal without bruits. No thyromegaly. Lungs: equal expansion, clear bilaterally CV: Apex is discrete and nondisplaced, RRR without  murmur or gallop Abd: soft, NT, +BS, no bruit, no hepatosplenomegaly Back: no CVA tenderness Ext: no C/C/E        DP/PT pulses intact and = Skin: warm and dry without rash Neuro: CNII-XII intact             Strength intact = bilaterally  EKG:  NSR 62 bpm, nonspecific T wave abnormality  2-D echocardiogram 04/23/2012: Left ventricle: The cavity size was normal. Systolic function was normal. The estimated ejection fraction was in the range of 50% to 55%. Wall motion was normal; there were no regional wall motion abnormalities.  ------------------------------------------------------------ Aortic valve: Poorly visualized. Trileaflet; normal thickness leaflets. Mobility was not restricted. Doppler: Transvalvular velocity was within the normal range. There was no stenosis. No regurgitation.  ------------------------------------------------------------ Aorta: Aortic root: The aortic root was normal in size.  ------------------------------------------------------------ Mitral valve: Structurally normal valve. Mobility was not restricted. Doppler: Transvalvular velocity was within the normal range. There was no evidence for stenosis. No regurgitation. Peak gradient: 73mm Hg (D).  ------------------------------------------------------------ Left atrium: The atrium was normal in size.  ------------------------------------------------------------ Right ventricle: The cavity size was normal. Wall thickness was normal. Systolic function was normal.  ------------------------------------------------------------ Pulmonic valve: Doppler: Transvalvular velocity was within the normal range. There was no evidence for stenosis.  ------------------------------------------------------------ Tricuspid valve: Structurally normal valve. Doppler: Transvalvular velocity was within the normal range. No regurgitation.  ------------------------------------------------------------ Pulmonary artery:  The main pulmonary artery was normal-sized. Systolic pressure was within the normal range.  ------------------------------------------------------------ Right atrium: The atrium was normal in size.  ------------------------------------------------------------ Pericardium: There was no pericardial effusion.  ------------------------------------------------------------ Systemic veins: Inferior vena cava: The vessel was normal in size.  ASSESSMENT AND PLAN: 1. Heart palpitations. Increasing in frequency with associated symptoms of shortness of breath. The patient has also had near syncope. I have recommended a 48-hour Holter monitor. Suspect she is having symptomatic PVCs or PACs, but probably best to better define these. She has no evidence of structural heart disease by exam. An echocardiogram in 2013 was normal. I don't think further cardiac imaging is indicated. We discussed lifestyle modification including regular exercise, avoidance of caffeine, and management of stress and sleep habits.  2. Near syncope. Possibly related to #1. In addition, this patient typically runs a very low resting blood pressure. I think she is predisposed to orthostatic changes. Discussed avoidance of caffeine and liberalization of salt as well as adequate fluid intake.  Will followup with her with results of her Holter monitor. If symptoms are stable, plan followup in one year. We did discuss consideration of a beta blocker or other medical therapy for palpitations, but she is not inclined to take daily medicine unless absolutely necessary. I would agree with this approach.  Sherren Mocha 11/05/2013 2:44 PM

## 2013-11-07 ENCOUNTER — Encounter: Payer: 59 | Admitting: Cardiovascular Disease

## 2013-11-12 ENCOUNTER — Encounter: Payer: Self-pay | Admitting: *Deleted

## 2013-11-12 ENCOUNTER — Encounter (INDEPENDENT_AMBULATORY_CARE_PROVIDER_SITE_OTHER): Payer: 59

## 2013-11-12 DIAGNOSIS — R002 Palpitations: Secondary | ICD-10-CM

## 2013-11-12 NOTE — Progress Notes (Signed)
Patient ID: Stefanie [name], female   DOB: Jun 06, 1966, 48 y.o.   MRN: [id number] E-Cardio 48 hour holter monitor applied to patient.

## 2013-11-18 ENCOUNTER — Telehealth: Payer: Self-pay | Admitting: Cardiovascular Disease

## 2013-11-18 DIAGNOSIS — I493 Ventricular premature depolarization: Secondary | ICD-10-CM

## 2013-11-18 DIAGNOSIS — I491 Atrial premature depolarization: Secondary | ICD-10-CM

## 2013-11-18 NOTE — Telephone Encounter (Signed)
Calling requesting results of 48 hr holter monitor.  Was returned 1/15.  Advised that  report is ready and is on Dr. Antionette Char desk to review.  States she is still having symptoms.  States she has feeling in chest that is not normal like a fluttering and sometimes pain.  Wants to know if needs an appointment with Dr. Burt Knack or her PCP.  States she was unable to see PCP Dr. Asa Lente until June.  States she wants to know if this is cardiac related or if see needs to see the PCP sooner than June. Advised will forward note to Dr. Burt Knack and his nurse Duaine Dredge.

## 2013-11-18 NOTE — Telephone Encounter (Signed)
New message         Pt would like results from most recent holter monitor

## 2013-11-20 NOTE — Telephone Encounter (Signed)
Holter reviewed with few PVC's and PAC's. With chest pain would go ahead and do a stress echo to rule out ischemia. thx  Sherren Mocha 11/20/2013 1:58 PM

## 2013-11-20 NOTE — Telephone Encounter (Signed)
Left message for patient to call office for monitor results 

## 2013-11-21 ENCOUNTER — Encounter: Payer: Self-pay | Admitting: Nurse Practitioner

## 2013-11-21 NOTE — Telephone Encounter (Signed)
Spoke with patient and reviewed monitor results.  Patient verbalized understanding.  Patient would like Dr. Burt Knack to be aware that she is thankful that the monitor did catch the arrhythmias because she does feel something is different about her heart rhythm.  I advised patient Dr. Burt Knack recommends a stress echo to r/o ischemia and patient is agreeable, however she would also like Dr. Burt Knack to consider a chest CT as she has known brain aneurysms and is wondering if she also has weak vessels in her heart.  I advised that I will send message to Dr. Burt Knack for advice and that she will receive a call to schedule stress echo.  Patient verbalized understanding and agreement. Stress echo order in epic.

## 2013-11-21 NOTE — Telephone Encounter (Signed)
Follow Up   Pt returning your call from yesterday. Please call back.

## 2013-11-21 NOTE — Telephone Encounter (Signed)
No indication for cardiac CT and it wouldn't be approved by her insurance. Would proceed with stress echo and if normal there is no further ischemic workup needed. thx

## 2013-11-22 NOTE — Telephone Encounter (Signed)
Spoke with patient and informed her of Dr. Antionette Char advice.  Patient verbalized understanding and asked if Dr. Burt Knack can do anything to get her a sooner appointment with Dr. Asa Lente.  Her current appointment is scheduled for June and patient would like to see her sooner; she is not returning to her previous PCP.  I advised patient I will send message to Dr. Burt Knack for advice. Patient verbalized understanding and agreement.

## 2013-11-25 NOTE — Telephone Encounter (Signed)
Will check with her. thx

## 2013-12-12 ENCOUNTER — Ambulatory Visit (HOSPITAL_COMMUNITY): Payer: 59 | Attending: Cardiology

## 2013-12-12 ENCOUNTER — Encounter: Payer: Self-pay | Admitting: Cardiology

## 2013-12-12 DIAGNOSIS — R0602 Shortness of breath: Secondary | ICD-10-CM

## 2013-12-12 DIAGNOSIS — R42 Dizziness and giddiness: Secondary | ICD-10-CM | POA: Insufficient documentation

## 2013-12-12 DIAGNOSIS — R002 Palpitations: Secondary | ICD-10-CM | POA: Insufficient documentation

## 2013-12-12 DIAGNOSIS — R55 Syncope and collapse: Secondary | ICD-10-CM

## 2013-12-12 DIAGNOSIS — I4949 Other premature depolarization: Secondary | ICD-10-CM | POA: Insufficient documentation

## 2013-12-12 DIAGNOSIS — I493 Ventricular premature depolarization: Secondary | ICD-10-CM

## 2013-12-12 DIAGNOSIS — I491 Atrial premature depolarization: Secondary | ICD-10-CM | POA: Insufficient documentation

## 2013-12-12 NOTE — Progress Notes (Signed)
Stress Echocardiogram performed.  

## 2013-12-18 ENCOUNTER — Telehealth: Payer: Self-pay | Admitting: Cardiovascular Disease

## 2013-12-18 DIAGNOSIS — R1310 Dysphagia, unspecified: Secondary | ICD-10-CM

## 2013-12-18 NOTE — Telephone Encounter (Signed)
Follow Up  ° °Pt returned the call  °

## 2013-12-18 NOTE — Telephone Encounter (Signed)
Called patient to review stress echo results with her.  Patient verbalized understanding and would like to know what the next step will be, states her symptoms of heart fluttering, light-headedness and SOB remain.  Patient states she believes the SOB may be worse, states she noticed the other day when walking up the stairs that she got more winded than usual.  Patient states she occasionally has difficulty swallowing or has pain after swallowing.  Patient states "it feels like there is something in my chest."  Patient states she has never had an endoscopy that she is aware of.  Patient would like Dr. Burt Knack to order further testing as her symptoms remain even when she attempts to ignore them.  I advised patient that I will send message to Dr. Burt Knack for advice and that I will call her back before the end of the week.  Patient verbalized understanding and agreement.

## 2013-12-25 NOTE — Telephone Encounter (Signed)
Left message with patient on her voicemail:  If still having symptomatic palpitations, could try metoprolol 12.5 mg BID. Regarding swallowing issues and feeling like something is in her chest, recommend PCP and/or GI evaluation.  Her cardiac testing (48 hr holter and stress echo) are reassuring.   thx

## 2013-12-30 ENCOUNTER — Encounter: Payer: Self-pay | Admitting: Gastroenterology

## 2013-12-30 NOTE — Telephone Encounter (Signed)
Spoke with patient who states she spoke with Dr. Burt Knack last week and is aware that she is being referred to [name].  I advised patient she will receive a call from their office with appointment.  Patient verbalized understanding and gratitude.

## 2014-01-14 ENCOUNTER — Other Ambulatory Visit: Payer: Self-pay

## 2014-01-14 DIAGNOSIS — Z1231 Encounter for screening mammogram for malignant neoplasm of breast: Secondary | ICD-10-CM

## 2014-01-27 ENCOUNTER — Emergency Department
Admission: EM | Admit: 2014-01-27 | Discharge: 2014-01-27 | Disposition: A | Discharge status: Home / Self Care | Payer: 59 | Source: Home / Self Care | Attending: Family Medicine | Admitting: Family Medicine

## 2014-01-27 ENCOUNTER — Encounter: Payer: Self-pay | Admitting: Emergency Medicine

## 2014-01-27 DIAGNOSIS — H02849 Edema of unspecified eye, unspecified eyelid: Secondary | ICD-10-CM

## 2014-01-27 DIAGNOSIS — H02843 Edema of right eye, unspecified eyelid: Secondary | ICD-10-CM

## 2014-01-27 MED ORDER — ACYCLOVIR 400 MG PO TABS: 400.0000 mg | ORAL_TABLET | Freq: Three times a day (TID) | ORAL | Status: DC

## 2014-01-27 NOTE — ED Notes (Signed)
Stefanie [name] c/o sharp pain in her right eye that traveled down her face 2 days ago and quickly resolved. Today she noted swelling in the corner of her right eye. Denies vision changes. 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

## 2014-01-27 NOTE — ED Provider Notes (Signed)
CSN: [id number]     Arrival date & time 01/27/14  1840 History   First MD Initiated Contact with Patient 01/27/14 1859     Chief Complaint  Patient presents with  . eye swelling       HPI Comments: Two days ago patient experienced a brief episode of sharp pain in her right eye that radiated to her right cheek, then quickly resolved.  Today she noted swelling in the corner of her right eye, and now painless swelling of her right upper eyelid.  She denies vision changes.  She feels well otherwise except for fatigue from increased stress. 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  Patient is a 48 y.o. female presenting with eye pain. The history is provided by the patient.  Eye Pain This is a new problem. The current episode started 2 days ago. The problem occurs rarely. The problem has not changed since onset.Associated symptoms comments: Mildly painful swelling of right upper medial eyelid. Nothing aggravates the symptoms. Nothing relieves the symptoms. Treatments tried: L-lysine. The treatment provided mild relief.    Past Medical History  Diagnosis Date  . Melanoma   . Brain aneurysm   . IBS (irritable bowel syndrome)   . Asthma   . Heart palpitations    Past Surgical History  Procedure Laterality Date  . Speenoidectomy    . Lasik    . Abdominal hysterectomy     Family History  Problem Relation Age of Onset  . Rheum arthritis Mother   . Diabetes Father   . Hypertension Father    History  Substance Use Topics  . Smoking status: Never Smoker   . Smokeless tobacco: Never Used  . Alcohol Use: No   OB History   Grav Para Term Preterm Abortions TAB SAB Ect Mult Living                 Review of Systems  Constitutional: Negative for fever, chills, activity change and fatigue.  HENT: Negative for congestion, ear pain, facial swelling and sore throat.   Eyes: Positive for pain. Negative for photophobia,  discharge, redness, itching and visual disturbance.  Respiratory: Negative.   Cardiovascular: Negative.   Gastrointestinal: Negative.   Endocrine: Negative.   Genitourinary: Negative.   Musculoskeletal: Negative.     Allergies  Morphine and related; Coppertone spf4; and Sulfa antibiotics  Home Medications   Current Outpatient Rx  Name  Route  Sig  Dispense  Refill  . L-Lysine HCl 1000 MG TABS   Oral   Take by mouth.         Marland Kitchen acyclovir (ZOVIRAX) 400 MG tablet   Oral   Take 1 tablet (400 mg total) by mouth 3 (three) times daily.   15 tablet   1    BP 127/79  Pulse 65  Temp(Src) 97.6 F (36.4 C) (Oral)  Resp 14  Wt 115 lb (52.164 kg)  SpO2 99% Physical Exam  Nursing note and vitals reviewed. Constitutional: She appears well-developed and well-nourished. No distress.  HENT:  Head: Normocephalic and atraumatic.  Right Ear: External ear normal.  Left Ear: External ear normal.  Nose: Nose normal.  Mouth/Throat: Oropharynx is clear and moist.  Eyes: Conjunctivae and EOM are normal. Pupils are equal, round, and reactive to light. Right eye exhibits no chemosis, no discharge, no exudate and no hordeolum. No foreign body present in the right eye.  There is mild tenderness and edema of the right upper eyelid as noted on diagram  Neck: Neck supple.  Cardiovascular: Normal heart sounds.   Pulmonary/Chest: Breath sounds normal.  Abdominal: Soft. There is no tenderness.  Lymphadenopathy:    She has no cervical adenopathy.  Neurological: She is alert.  Skin: Skin is warm and dry.    ED Course  Procedures        MDM   1. 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 Begin cool compresses several times daily to right eye.  May use refrigerated lubricating eye drops as needed. Followup with ophthalmologist if not improved in two days.    Kandra Nicolas, MD 01/30/14 2322

## 2014-01-27 NOTE — Discharge Instructions (Signed)
Begin cool compresses several times daily to right eye.  May use refrigerated lubricating eye drops as needed.

## 2014-01-28 ENCOUNTER — Encounter: Payer: Self-pay | Admitting: Gastroenterology

## 2014-02-03 ENCOUNTER — Encounter: Payer: Self-pay | Admitting: Gastroenterology

## 2014-02-03 ENCOUNTER — Ambulatory Visit (INDEPENDENT_AMBULATORY_CARE_PROVIDER_SITE_OTHER): Payer: 59 | Admitting: Gastroenterology

## 2014-02-03 VITALS — BP 102/70 | HR 64 | Ht 62.0 in | Wt 117.4 lb

## 2014-02-03 DIAGNOSIS — R634 Abnormal weight loss: Secondary | ICD-10-CM

## 2014-02-03 DIAGNOSIS — R1319 Other dysphagia: Secondary | ICD-10-CM

## 2014-02-03 DIAGNOSIS — K219 Gastro-esophageal reflux disease without esophagitis: Secondary | ICD-10-CM

## 2014-02-03 DIAGNOSIS — K5900 Constipation, unspecified: Secondary | ICD-10-CM

## 2014-02-03 DIAGNOSIS — R079 Chest pain, unspecified: Secondary | ICD-10-CM

## 2014-02-03 MED ORDER — OMEPRAZOLE 20 MG PO CPDR: 20.0000 mg | DELAYED_RELEASE_CAPSULE | Freq: Every day | ORAL | Status: DC

## 2014-02-03 NOTE — Patient Instructions (Addendum)
We have sent the following medications to your pharmacy for you to pick up at your convenience:omeprazole.  You have been scheduled for a Barium Esophagram at [facility] (1st floor of the hospital) on 02/05/14 at 10:00am. Please arrive 15 minutes prior to your appointment for registration. Make certain not to have anything to eat or drink 6 hours prior to your test. If you need to reschedule for any reason, please contact radiology at [phone] to do so. __________________________________________________________________ A barium swallow is an examination that concentrates on views of the esophagus. This tends to be a double contrast exam (barium and two liquids which, when combined, create a gas to distend the wall of the oesophagus) or single contrast (non-ionic iodine based). The study is usually tailored to your symptoms so a good history is essential. Attention is paid during the study to the form, structure and configuration of the esophagus, looking for functional disorders (such as aspiration, dysphagia, achalasia, motility and reflux) EXAMINATION You may be asked to change into a gown, depending on the type of swallow being performed. A radiologist and radiographer will perform the procedure. The radiologist will advise you of the type of contrast selected for your procedure and direct you during the exam. You will be asked to stand, sit or lie in several different positions and to hold a small amount of fluid in your mouth before being asked to swallow while the imaging is performed .In some instances you may be asked to swallow barium coated marshmallows to assess the motility of a solid food bolus. The exam can be recorded as a digital or video fluoroscopy procedure. POST PROCEDURE It will take 1-2 days for the barium to pass through your system. To facilitate this, it is important, unless otherwise directed, to increase your fluids for the next 24-48hrs and to resume your normal  diet.  This test typically takes about 30 minutes to perform. __________________________________________________________________________________  Dennis Bast have been scheduled for an endoscopy with propofol. Please follow written instructions given to you at your visit today. If you use inhalers (even only as needed), please bring them with you on the day of your procedure. Your physician has requested that you go to www.startemmi.com and enter the access code given to you at your visit today. This web site gives a general overview about your procedure. However, you should still follow specific instructions given to you by our office regarding your preparation for the procedure.  Thank you for choosing me and [facility].  Pricilla Riffle. Dagoberto Ligas., MD., [facility]

## 2014-02-03 NOTE — Progress Notes (Signed)
    History of Present Illness: This is a 48 year old female with multiple complaints. Her GI complaints center around intermittent difficulties swallowing liquids and solids frequent postprandial belching early satiety and a 12 pound weight loss. All the symptoms have occurred over the past 2 months. She has chronic constipation which has not changed. She has multiple other symptoms including total body itching, palpitations, chest pain is clearly exacerbated by pressure and touch, presyncope and hearing disturbances. Abdominal/pelvic CT scan in June 2014 showed a 2 cm left ovarian cyst and a moderate to large colonic stool burden. Denies diarrhea, change in stool caliber, melena, hematochezia, nausea, vomiting.  Review of Systems: Pertinent positive and negative review of systems were noted in the above HPI section. All other review of systems were otherwise negative.  Current Medications, Allergies, Past Medical History, Past Surgical History, Family History and Social History were reviewed in [employer] record.  Physical Exam: General: Well developed , well nourished, no acute distress Head: Normocephalic and atraumatic, slight rash on the right face-recovering from shingles Eyes:  sclerae anicteric, EOMI Ears: Normal auditory acuity Mouth: No deformity or lesions Neck: Supple, no masses or thyromegaly Lungs: Clear throughout to auscultation, sternal and xiphoid area tenderness to light and moderate palpation Heart: Regular rate and rhythm; no murmurs, rubs or bruits Abdomen: Soft, non tender and non distended. No masses, hepatosplenomegaly or hernias noted. Normal Bowel sounds Musculoskeletal: Symmetrical with no gross deformities  Skin: No lesions on visible extremities Pulses:  Normal pulses noted Extremities: No clubbing, cyanosis, edema or deformities noted Neurological: Alert oriented x 4, grossly nonfocal Cervical Nodes:  No significant cervical  adenopathy Inguinal Nodes: No significant inguinal adenopathy Psychological:  Alert and cooperative. Flat affect  Assessment and Recommendations:  1. Dysphagia, intermittent to solids and liquids. Early satiety. Mild weight loss. Presumed GERD. Rule out esophagitis, ulcer and esophageal stricture. Schedule barium esophagram and upper endoscopy. The risks, benefits, and alternatives to endoscopy with possible biopsy and possible dilation were discussed with the patient and they consent to proceed. Begin omeprazole 20 mg daily and standard antireflux measures.  2. Chronic constipation. Increase dietary fiber and water intake. Consider MiraLax once or twice daily.  3. Chest pain related to chest wall tenderness, per PCP.  4. History of left ovarian cyst. Followup per her PCP and GYN.

## 2014-02-05 ENCOUNTER — Ambulatory Visit (HOSPITAL_COMMUNITY)
Admission: RE | Admit: 2014-02-05 | Discharge: 2014-02-05 | Disposition: A | Discharge status: Home / Self Care | Payer: 59 | Source: Ambulatory Visit | Attending: Gastroenterology | Admitting: Gastroenterology

## 2014-02-05 DIAGNOSIS — R634 Abnormal weight loss: Secondary | ICD-10-CM | POA: Insufficient documentation

## 2014-02-05 DIAGNOSIS — K219 Gastro-esophageal reflux disease without esophagitis: Secondary | ICD-10-CM | POA: Insufficient documentation

## 2014-02-05 DIAGNOSIS — R1319 Other dysphagia: Secondary | ICD-10-CM

## 2014-02-05 DIAGNOSIS — R1310 Dysphagia, unspecified: Secondary | ICD-10-CM | POA: Insufficient documentation

## 2014-02-05 DIAGNOSIS — R079 Chest pain, unspecified: Secondary | ICD-10-CM

## 2014-02-05 DIAGNOSIS — K224 Dyskinesia of esophagus: Secondary | ICD-10-CM | POA: Insufficient documentation

## 2014-02-17 ENCOUNTER — Ambulatory Visit: Payer: 59 | Admitting: Gastroenterology

## 2014-02-18 ENCOUNTER — Encounter: Payer: Self-pay | Admitting: Gastroenterology

## 2014-02-18 ENCOUNTER — Ambulatory Visit (AMBULATORY_SURGERY_CENTER): Payer: 59 | Admitting: Gastroenterology

## 2014-02-18 VITALS — BP 113/76 | HR 64 | Temp 96.5°F | Resp 9 | Wt 117.0 lb

## 2014-02-18 DIAGNOSIS — R1319 Other dysphagia: Secondary | ICD-10-CM

## 2014-02-18 DIAGNOSIS — R634 Abnormal weight loss: Secondary | ICD-10-CM

## 2014-02-18 MED ORDER — SODIUM CHLORIDE 0.9 % IV SOLN: 500.0000 mL | INTRAVENOUS | Status: DC

## 2014-02-18 NOTE — Patient Instructions (Signed)

## 2014-02-18 NOTE — Op Note (Signed)
[place]  [address] [place], [address]   ENDOSCOPY PROCEDURE REPORT  PATIENT: [name], Stefanie Kontz  MR#: [id number] BIRTHDATE: 12-17-1965 , 60  yrs. old GENDER: Female ENDOSCOPIST: Ladene Artist, MD, [facility] REFERRED BY:  Gwendolyn Grant, M.D. PROCEDURE DATE:  02/18/2014 PROCEDURE:  EGD, diagnostic ASA CLASS:     Class II INDICATIONS:  Dysphagia.   Weight loss. MEDICATIONS: MAC sedation, administered by CRNA and propofol (Diprivan) 100mg  IV TOPICAL ANESTHETIC: Cetacaine Spray DESCRIPTION OF PROCEDURE: After the risks benefits and alternatives of the procedure were thoroughly explained, informed consent was obtained.  The LB [id number] endoscope was introduced through the mouth and advanced to the second portion of the duodenum. Without limitations.  The instrument was slowly withdrawn as the mucosa was fully examined.  ESOPHAGUS: The mucosa of the esophagus appeared normal. STOMACH: The mucosa and folds of the stomach appeared normal. DUODENUM: The duodenal mucosa showed no abnormalities in the bulb and second portion of the duodenum.  Retroflexed views revealed no abnormalities.   The scope was then withdrawn from the patient and the procedure completed.  COMPLICATIONS: There were no complications.  ENDOSCOPIC IMPRESSION: 1.   The EGD appeared normal  RECOMMENDATIONS: 1.  Anti-reflux regimen 2.  Continue PPI 3.  Dysphagia related to esophageal dysmotility  eSigned:  Ladene Artist, MD, [facility] 02/18/2014 9:27 AM

## 2014-02-19 ENCOUNTER — Telehealth: Payer: Self-pay

## 2014-02-19 NOTE — Telephone Encounter (Signed)
Left a message at [phone] for the pt to call us back if any questions or concerns. Maw

## 2014-02-28 ENCOUNTER — Ambulatory Visit
Admission: RE | Admit: 2014-02-28 | Discharge: 2014-02-28 | Disposition: A | Discharge status: Home / Self Care | Payer: 59 | Source: Ambulatory Visit

## 2014-02-28 DIAGNOSIS — Z1231 Encounter for screening mammogram for malignant neoplasm of breast: Secondary | ICD-10-CM

## 2014-03-10 ENCOUNTER — Other Ambulatory Visit: Payer: Self-pay | Admitting: Dermatology

## 2014-03-17 ENCOUNTER — Other Ambulatory Visit: Payer: Self-pay | Admitting: Dermatology

## 2014-04-15 ENCOUNTER — Other Ambulatory Visit: Payer: Self-pay | Admitting: Dermatology

## 2014-04-18 ENCOUNTER — Emergency Department (HOSPITAL_COMMUNITY)
Admission: EM | Admit: 2014-04-18 | Discharge: 2014-04-18 | Disposition: A | Discharge status: Home / Self Care | Payer: 59 | Attending: Emergency Medicine | Admitting: Emergency Medicine

## 2014-04-18 ENCOUNTER — Encounter (HOSPITAL_COMMUNITY): Payer: Self-pay | Admitting: Emergency Medicine

## 2014-04-18 DIAGNOSIS — Z85820 Personal history of malignant melanoma of skin: Secondary | ICD-10-CM | POA: Insufficient documentation

## 2014-04-18 DIAGNOSIS — B029 Zoster without complications: Secondary | ICD-10-CM | POA: Insufficient documentation

## 2014-04-18 DIAGNOSIS — Z8669 Personal history of other diseases of the nervous system and sense organs: Secondary | ICD-10-CM | POA: Insufficient documentation

## 2014-04-18 DIAGNOSIS — J45909 Unspecified asthma, uncomplicated: Secondary | ICD-10-CM | POA: Insufficient documentation

## 2014-04-18 DIAGNOSIS — Z8679 Personal history of other diseases of the circulatory system: Secondary | ICD-10-CM | POA: Insufficient documentation

## 2014-04-18 DIAGNOSIS — Z79899 Other long term (current) drug therapy: Secondary | ICD-10-CM | POA: Insufficient documentation

## 2014-04-18 DIAGNOSIS — H53149 Visual discomfort, unspecified: Secondary | ICD-10-CM | POA: Insufficient documentation

## 2014-04-18 DIAGNOSIS — Z8719 Personal history of other diseases of the digestive system: Secondary | ICD-10-CM | POA: Insufficient documentation

## 2014-04-18 MED ORDER — FLUORESCEIN SODIUM 1 MG OP STRP: 1.0000 | ORAL_STRIP | Freq: Once | OPHTHALMIC | Status: DC

## 2014-04-18 MED ORDER — VALACYCLOVIR HCL 1 G PO TABS: 1000.0000 mg | ORAL_TABLET | Freq: Three times a day (TID) | ORAL | Status: DC

## 2014-04-18 MED ORDER — TETRACAINE HCL 0.5 % OP SOLN
1.0000 [drp] | Freq: Once | OPHTHALMIC | Status: DC
  Filled 2014-04-18: qty 2

## 2014-04-18 NOTE — ED Notes (Signed)
Patient is alert and oriented x3.  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  She previously had shingles in the right eye and surround face in April.  Currently  She rates her pain 5 of 10.  She has began to have visual issues with a define line on the cornea  That shows a half blurred and half clear image.

## 2014-04-18 NOTE — Discharge Instructions (Signed)
Shingles °Shingles (herpes zoster) is an infection that is caused by the same virus that causes chickenpox (varicella). The infection causes a painful skin rash and fluid-filled blisters, which eventually break open, crust over, and heal. It may occur in any area of the body, but it usually affects only one side of the body or face. The pain of shingles usually lasts about 1 month. However, some people with shingles may develop long-term (chronic) pain in the affected area of the body. °Shingles often occurs many years after the person had chickenpox. It is more common: °· In people older than 50 years. °· In people with weakened immune systems, such as those with HIV, AIDS, or cancer. °· In people taking medicines that weaken the immune system, such as transplant medicines. °· In people under great stress. °CAUSES  °Shingles is caused by the varicella zoster virus (VZV), which also causes chickenpox. After a person is infected with the virus, it can remain in the person's body for years in an inactive state (dormant). To cause shingles, the virus reactivates and breaks out as an infection in a nerve root. °The virus can be spread from person to person (contagious) through contact with open blisters of the shingles rash. It will only spread to people who have not had chickenpox. When these people are exposed to the virus, they may develop chickenpox. They will not develop shingles. Once the blisters scab over, the person is no longer contagious and cannot spread the virus to others. °SYMPTOMS  °Shingles shows up in stages. The initial symptoms may be pain, itching, and tingling in an area of the skin. This pain is usually described as burning, stabbing, or throbbing. In a few days or weeks, a painful red rash will appear in the area where the pain, itching, and tingling were felt. The rash is usually on one side of the body in a band or belt-like pattern. Then, the rash usually turns into fluid-filled blisters. They  will scab over and dry up in approximately 2-3 weeks. °Flu-like symptoms may also occur with the initial symptoms, the rash, or the blisters. These may include: °· Fever. °· Chills. °· Headache. °· Upset stomach. °DIAGNOSIS  °Your caregiver will perform a skin exam to diagnose shingles. Skin scrapings or fluid samples may also be taken from the blisters. This sample will be examined under a microscope or sent to a lab for further testing. °TREATMENT  °There is no specific cure for shingles. Your caregiver will likely prescribe medicines to help you manage the pain, recover faster, and avoid long-term problems. This may include antiviral drugs, anti-inflammatory drugs, and pain medicines. °HOME CARE INSTRUCTIONS  °· Take a cool bath or apply cool compresses to the area of the rash or blisters as directed. This may help with the pain and itching.   °· Only take over-the-counter or prescription medicines as directed by your caregiver.   °· Rest as directed by your caregiver. °· Keep your rash and blisters clean with mild soap and cool water or as directed by your caregiver.  °· Do not pick your blisters or scratch your rash. Apply an anti-itch cream or numbing creams to the affected area as directed by your caregiver. °· Keep your shingles rash covered with a loose bandage (dressing). °· Avoid skin contact with: °¨ Babies.   °¨ Pregnant women.   °¨ Children with eczema.   °¨ Elderly people with transplants.   °¨ People with chronic illnesses, such as leukemia or AIDS.   °· Wear loose-fitting clothing to help ease the   pain of material rubbing against the rash. °· Keep all follow-up appointments with your caregiver. If the area involved is on your face, you may receive a referral for follow-up to a specialist, such as an eye doctor (ophthalmologist) or an ear, nose, and throat (ENT) doctor. Keeping all follow-up appointments will help you avoid eye complications, chronic pain, or disability.   °SEEK IMMEDIATE MEDICAL  CARE IF:  °· You have facial pain, pain around the eye area, or loss of feeling on one side of your face. °· You have ear pain or ringing in your ear. °· You have loss of taste. °· Your pain is not relieved with prescribed medicines.   °· Your redness or swelling spreads.   °· You have more pain and swelling.  °· Your condition is worsening or has changed.   °· You have a fever or persistent symptoms for more than 2-3 days. °· You have a fever and your symptoms suddenly get worse. °MAKE SURE YOU: °· Understand these instructions. °· Will watch your condition. °· Will get help right away if you are not doing well or get worse. °Document Released: 10/17/2005 Document Revised: 07/11/2012 Document Reviewed: 05/31/2012 °Exit[employer]® Patient Information ©2015 [employer]. This information is not intended to replace advice given to you by your health care provider. Make sure you discuss any questions you have with your health care provider. ° °

## 2014-04-18 NOTE — ED Provider Notes (Signed)
History/physical exam/procedure(s) were performed by non-physician practitioner and as supervising physician I was immediately available for consultation/collaboration. I have reviewed all notes and am in agreement with care and plan.   Shaune Pollack, MD 04/18/14 [phone]

## 2014-04-18 NOTE — ED Provider Notes (Signed)
CSN: [id number]     Arrival date & time 04/18/14  1956 History   First MD Initiated Contact with Patient 04/18/14 2001     Chief Complaint  Patient presents with  . Herpes Zoster    left eye   HPI Comments: Patient is a 48 y.o. Female who presents to the [facility] ED with 2 days of left eye swelling and eye pain.  Patient states that eye pain is a 5/10 pain which is associated with pain with any pressure of the skin, eye tearing, thin yellow eye discharge, and loss of the peripheral vision on the left side.  Patient states that this feels very similar to when she had a prior outbreak of herpes zoster on the right side of her face.  So far she has tried taking acyclovir 800 mg TID and has tried a topical antiviral gel that was prescribed to her by her ophthalmologist for her last Herpes zoster outbreak.  Patient has a scheduled follow-up with her ophthalmologist on Monday at 8:30 but was concerned that she may have an ulcer and wanted to be evaluated prior to this.      The history is provided by the patient. No language interpreter was used.     Past Medical History  Diagnosis Date  . Melanoma   . Brain aneurysm   . IBS (irritable bowel syndrome)   . Heart palpitations   . Asthma     per the pt asthma aiwway disease  . Allergy   . Shingles    Past Surgical History  Procedure Laterality Date  . Speenoidectomy    . Lasik    . Abdominal hysterectomy    . Right leg fx    . Wisdom tooth extraction     Family History  Problem Relation Age of Onset  . Rheum arthritis Mother   . Diabetes Father   . Hypertension Father   . Heart disease Father   . Esophageal cancer Paternal Aunt   . Rectal cancer Neg Hx   . Stomach cancer Neg Hx   . Colon cancer Cousin   . Pancreatic cancer Cousin    History  Substance Use Topics  . Smoking status: Never Smoker   . Smokeless tobacco: Never Used  . Alcohol Use: No   OB History   Grav Para Term Preterm Abortions TAB SAB Ect Mult Living                  Review of Systems  Constitutional: Negative for fever, chills and fatigue.  HENT: Positive for facial swelling. Negative for congestion, ear discharge, ear pain, hearing loss, mouth sores, postnasal drip, rhinorrhea, sinus pressure, sore throat and trouble swallowing.   Eyes: Positive for photophobia, pain, discharge, redness and visual disturbance.  Respiratory: Negative for chest tightness and shortness of breath.   Cardiovascular: Negative for chest pain, palpitations and leg swelling.  All other systems reviewed and are negative.     Allergies  Morphine and related; Coppertone spf4; and Sulfa antibiotics  Home Medications   Prior to Admission medications   Medication Sig Start Date End Date Taking? Authorizing Jillaine Waren  acyclovir (ZOVIRAX) 400 MG tablet Take 800 mg by mouth every 4 (four) hours.   Yes Historical Reuel Lamadrid, MD  Artificial Tear Ointment (ARTIFICIAL TEARS) ointment Place 1 drop into both eyes as needed (dry eyes).   Yes Historical Assyria Morreale, MD  valACYclovir (VALTREX) 1000 MG tablet Take 1 tablet (1,000 mg total) by mouth 3 (three) times daily. 04/18/14  Courtney A Forucci, PA-C   BP 147/83  Pulse 64  Temp(Src) 98.5 F (36.9 C) (Oral)  Resp 20  SpO2 97% Physical Exam  Nursing note and vitals reviewed. Constitutional: She is oriented to person, place, and time. She appears well-developed and well-nourished. No distress.  HENT:  Head: Normocephalic and atraumatic. Head is with left periorbital erythema. Head is without abrasion and without contusion.  Right Ear: Hearing, tympanic membrane, external ear and ear canal normal.  Left Ear: Hearing, tympanic membrane, external ear and ear canal normal.  Nose: Nose normal. No mucosal edema or rhinorrhea.  Mouth/Throat: Uvula is midline, oropharynx is clear and moist and mucous membranes are normal.  Patient has extreme tenderness to palpation of the left side of the face to any type of pressure.  Eyes: EOM are  normal. Pupils are equal, round, and reactive to light. Left eye exhibits discharge. Left eye exhibits no chemosis. No foreign body present in the left eye. Right conjunctiva is not injected. Right conjunctiva has no hemorrhage. Left conjunctiva is injected. Left conjunctiva has no hemorrhage.  Left eye showed no increased uptake with fluoroscein dye on examination.    Neck: Normal range of motion. Neck supple. No thyromegaly present.  Cardiovascular: Normal rate, regular rhythm, normal heart sounds and intact distal pulses.  Exam reveals no gallop and no friction rub.   No murmur heard. Pulmonary/Chest: Effort normal and breath sounds normal. No respiratory distress. She has no wheezes. She has no rales. She exhibits no tenderness.  Abdominal: Soft. Bowel sounds are normal. She exhibits no distension and no mass. There is no tenderness. There is no rebound and no guarding.  Musculoskeletal: Normal range of motion.  Lymphadenopathy:    She has no cervical adenopathy.  Neurological: She is alert and oriented to person, place, and time. No cranial nerve deficit.  Skin: Skin is warm and dry. No rash noted. She is not diaphoretic. No erythema. No pallor.  Psychiatric: She has a normal mood and affect. Her behavior is normal. Judgment and thought content normal.    ED Course  Procedures (including critical care time) Labs Review Labs Reviewed - No data to display  Imaging Review No results found.   EKG Interpretation None      MDM   Final diagnoses:  Herpes zoster   Patient presents to the ED with left periorbital edema and pain.  Fluoroscein dye was placed in the eye and was examined with a [place] lamp.  There were no frank keratitic lesions or increased uptake at this time.  I spoke with the on-call doctor for [facility] who recommended that the patient be started on oral valtrex TID or acyclovir and to not use any topical at this time as there seemed to be no involvement of the  cornea.  I have prescribed 1,000 mg valtrex TID at this time.  The patient already has an appointment with Dr. Arnoldo Morale on Monday at 8:30 am and I have advised her to keep this appointment at this time.  I have offered the patient pain medication in the form of gabapentin but she has refused it.  Patient was given strong warning precautions of worsening eye pain or loss of vision to return immediately.  She states understanding at this time.  Dr. Jeanell Sparrow has been spoken to about this patient and is in agreement with the above plan.      Kenard Gower, PA-C 04/18/14 2252

## 2014-04-18 NOTE — ED Notes (Signed)
Patient is alert and oriented x3.  She was given DC instructions and follow up visit instructions.  Patient gave verbal understanding. She was DC ambulatory under her own power to home.  V/S stable.  He was not showing any signs of distress on DC 

## 2014-04-29 ENCOUNTER — Other Ambulatory Visit (INDEPENDENT_AMBULATORY_CARE_PROVIDER_SITE_OTHER): Payer: 59

## 2014-04-29 ENCOUNTER — Encounter: Payer: Self-pay | Admitting: Internal Medicine

## 2014-04-29 ENCOUNTER — Ambulatory Visit (INDEPENDENT_AMBULATORY_CARE_PROVIDER_SITE_OTHER): Payer: 59 | Admitting: Internal Medicine

## 2014-04-29 VITALS — BP 116/70 | HR 64 | Temp 96.9°F | Ht 61.75 in | Wt 110.1 lb

## 2014-04-29 DIAGNOSIS — R7989 Other specified abnormal findings of blood chemistry: Secondary | ICD-10-CM

## 2014-04-29 DIAGNOSIS — R945 Abnormal results of liver function studies: Secondary | ICD-10-CM

## 2014-04-29 DIAGNOSIS — M546 Pain in thoracic spine: Secondary | ICD-10-CM

## 2014-04-29 DIAGNOSIS — R1013 Epigastric pain: Secondary | ICD-10-CM

## 2014-04-29 DIAGNOSIS — R634 Abnormal weight loss: Secondary | ICD-10-CM

## 2014-04-29 DIAGNOSIS — D751 Secondary polycythemia: Secondary | ICD-10-CM

## 2014-04-29 DIAGNOSIS — M3500 Sicca syndrome, unspecified: Secondary | ICD-10-CM

## 2014-04-29 DIAGNOSIS — D8989 Other specified disorders involving the immune mechanism, not elsewhere classified: Secondary | ICD-10-CM

## 2014-04-29 DIAGNOSIS — R799 Abnormal finding of blood chemistry, unspecified: Secondary | ICD-10-CM

## 2014-04-29 DIAGNOSIS — R110 Nausea: Secondary | ICD-10-CM

## 2014-04-29 LAB — CBC WITH DIFFERENTIAL/PLATELET
Basophils Absolute: 0 10*3/uL (ref 0.0–0.1)
Basophils Relative: 0.4 % (ref 0.0–3.0)
Eosinophils Absolute: 0.2 10*3/uL (ref 0.0–0.7)
Eosinophils Relative: 2.3 % (ref 0.0–5.0)
HCT: 44.6 % (ref 36.0–46.0)
Hemoglobin: 15.2 g/dL — ABNORMAL HIGH (ref 12.0–15.0)
Lymphocytes Relative: 40.2 % (ref 12.0–46.0)
Lymphs Abs: 2.8 10*3/uL (ref 0.7–4.0)
MCHC: 34.2 g/dL (ref 30.0–36.0)
MCV: 86.8 fl (ref 78.0–100.0)
Monocytes Absolute: 0.5 10*3/uL (ref 0.1–1.0)
Monocytes Relative: 6.7 % (ref 3.0–12.0)
Neutro Abs: 3.5 10*3/uL (ref 1.4–7.7)
Neutrophils Relative %: 50.4 % (ref 43.0–77.0)
Platelets: 279 10*3/uL (ref 150.0–400.0)
RBC: 5.14 Mil/uL — ABNORMAL HIGH (ref 3.87–5.11)
RDW: 13.1 % (ref 11.5–15.5)
WBC: 6.9 10*3/uL (ref 4.0–10.5)

## 2014-04-29 LAB — BASIC METABOLIC PANEL
BUN: 11 mg/dL (ref 6–23)
CO2: 31 mEq/L (ref 19–32)
Calcium: 10.2 mg/dL (ref 8.4–10.5)
Chloride: 100 mEq/L (ref 96–112)
Creatinine, Ser: 0.7 mg/dL (ref 0.4–1.2)
GFR: 98.36 mL/min (ref >60.00)
Glucose, Bld: 93 mg/dL (ref 70–99)
Potassium: 3.7 mEq/L (ref 3.5–5.1)
Sodium: 137 mEq/L (ref 135–145)

## 2014-04-29 LAB — HEPATIC FUNCTION PANEL
ALT: 22 U/L (ref 0–35)
AST: 26 U/L (ref 0–37)
Albumin: 5.6 g/dL — ABNORMAL HIGH (ref 3.5–5.2)
Alkaline Phosphatase: 52 U/L (ref 39–117)
Bilirubin, Direct: 0.1 mg/dL (ref 0.0–0.3)
Total Bilirubin: 0.7 mg/dL (ref 0.2–1.2)
Total Protein: 9.3 g/dL — ABNORMAL HIGH (ref 6.0–8.3)

## 2014-04-29 LAB — SEDIMENTATION RATE: Sed Rate: 11 mm/hr (ref 0–22)

## 2014-04-29 LAB — TSH: TSH: 3.77 u[IU]/mL (ref 0.35–4.50)

## 2014-04-29 MED ORDER — VALACYCLOVIR HCL 1 G PO TABS: 1000.0000 mg | ORAL_TABLET | Freq: Three times a day (TID) | ORAL | Status: DC

## 2014-04-29 NOTE — Patient Instructions (Addendum)
It was good to see you today.  We have reviewed your prior records including labs and tests today  Test(s) ordered today. Your results will be released to [place] (or called to you) after review, usually within 72hours after test completion. If any changes need to be made, you will be notified at that same time.  we'll make referral for HIDA to evaluate possible gallbladder symptoms. Our office will contact you regarding appointment(s) once made.  Medications reviewed and updated, no changes recommended at this time. Refill on medication(s) as discussed today.  Please schedule followup in 3-4 months, call sooner if problems.

## 2014-04-29 NOTE — Progress Notes (Signed)
Pre visit review using our clinic review tool, if applicable. No additional management support is needed unless otherwise documented below in the visit note. 

## 2014-04-29 NOTE — Progress Notes (Signed)
Subjective:    Patient ID: Stefanie [name], female    DOB: 1966/06/03, 48 y.o.   MRN: [id number]  HPI  New patient to me, here to establish with primary care Stefanie [name] Multiple concerns, see CC Reviewed chronic medical issues and interval medical events:  Episodic events of light headedness and "heart attack symptoms" - Ongoing x 2 years (2 episodes each eval as IP) -?TIA vs other associated with chest tightness, abd discomfort (epigastric radiating to BUQ), bloating, nausea and L arm numbness Incidental dx brain aneurysm during eval for same late 2013 - follows at [facility] for same and felt to be unrelated to symptoms  Cardiac workup for same early 2015 negative with unremarkable holter and normal stress echo GI eval spring 2015 with neg CT a/p 03/2013 and barium swallow with mild dysmotility Pt concerned for ?GB problem  Also concerned for "weak immune system" because of recurrent HSV episodes in past 2 months, unintentional weight loss, new back pain with standing and lack of explanation for "heart attack symptoms". Reports always very healthy prior to onset of all symptoms in late 2013  Past Medical History  Diagnosis Date  . Melanoma   . Brain aneurysm   . IBS (irritable bowel syndrome)   . Heart palpitations     holter 10/2013 with few PVCs/PACs, normal stress echo 12/2013  . Asthma   . Allergy   . Shingles     recurrent HSV1   Family History  Problem Relation Age of Onset  . Rheum arthritis Mother   . Diabetes Father   . Hypertension Father   . Heart disease Father 66    MI with CABG x 5 age 54  . Esophageal cancer Paternal Aunt   . Rectal cancer Neg Hx   . Stomach cancer Neg Hx   . Colon cancer Cousin   . Pancreatic cancer Cousin   . Heart attack Brother 50   History  Substance Use Topics  . Smoking status: Never Smoker   . Smokeless tobacco: Never Used  . Alcohol Use: No    Review of Systems  Constitutional: Positive for appetite change, fatigue and  unexpected weight change (15# in last 6 mo). Negative for fever.  HENT: Positive for trouble swallowing. Negative for mouth sores, sinus pressure and tinnitus. Facial swelling: w/ recurrent HSV1 outbreaks in past months - periocular.   Eyes: Negative for visual disturbance.  Respiratory: Positive for chest tightness. Negative for cough and shortness of breath.   Cardiovascular: Positive for palpitations. Negative for leg swelling.  Gastrointestinal: Positive for nausea (episodic, occ severe) and abdominal pain (fullness, bloating and discomfort -episodic and severe). Negative for vomiting, diarrhea and constipation.  Musculoskeletal: Positive for back pain (upper lumbar with standing position, onset 1 mo ago, differnt than abd pain/nausea) and neck stiffness. Negative for arthralgias and joint swelling.  Skin: Negative for rash (recurrent HSV1, none at present).  Neurological: Positive for light-headedness (2 epsiodes presyncope (pressure in head with in/out hearing, nausea and abd/L arm discomfrt) 09/2012 and again 2014 - neg cardiac w/u early 2015). Negative for tremors, seizures, syncope, facial asymmetry, speech difficulty and weakness.  Psychiatric/Behavioral: Negative for sleep disturbance, dysphoric mood and decreased concentration. Nervous/anxious: related to fustration over lack of clear dx for sx.   All other systems reviewed and are negative.      Objective:   Physical Exam  BP 116/70  Pulse 64  Temp(Src) 96.9 F (36.1 C) (Oral)  Ht 5' 1.75" (1.568 m)  Wt  110 lb 1 oz (49.923 kg)  BMI 20.31 kg/m2  SpO2 99% Wt Readings from Last 3 Encounters:  04/29/14 110 lb 1 oz (49.923 kg)  02/18/14 117 lb (53.071 kg)  02/03/14 117 lb 6.4 oz (53.252 kg)   Constitutional: She is thin, and appears well-developed and well-nourished. No distress.  HENT: NCAT, TMs B clear without cerumen, erythema or effusion, OP clear with good dentition Neck: Normal range of motion. Neck supple. No JVD  present. No thyromegaly present.  Cardiovascular: Normal rate, regular rhythm and normal heart sounds.  No murmur heard. No BLE edema. Pulmonary/Chest: Effort normal and breath sounds normal. No respiratory distress. She has no wheezes.  Abdomen: SNTND, +BS, no r/g - no mass Mskel: no deformities or effusions -hands, wrists, knees - Back: full range of motion of thoracic and lumbar spine. Non tender to palpation. DTR's are symmetrically intact. Sensation intact in all dermatomes of the lower extremities. Full strength to manual muscle testing. patient ambulates with normal gait. Skin: no rash, lesions/ulcers or discolration Psychiatric: She has a mildly anxious mood and affect. Her behavior is normal. Judgment and thought content normal.   Lab Results  Component Value Date   WBC 8.0 10/02/2013   HGB 13.7 10/02/2013   HCT 39.2 10/02/2013   PLT 241 10/02/2013   GLUCOSE 94 10/02/2013   CHOL 185 04/22/2012   TRIG 76 04/22/2012   HDL 61 04/22/2012   LDLCALC 109* 04/22/2012   ALT 13 04/09/2013   AST 18 04/09/2013   NA 137 10/02/2013   K 3.5 10/02/2013   CL 103 10/02/2013   CREATININE 0.73 10/02/2013   BUN 11 10/02/2013   CO2 23 10/02/2013   TSH 3.550 10/01/2013   HGBA1C 5.4 04/22/2012    No results found.     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 Melanoma 2013 RLE - s/p excision - follows closely with derm for same Known cervical and lumbar DDD - advised follow up as ongoing at [facility] labs and refer for HIDA rule out bilary  dyskinesia Check autoimmune antibody given FH mom died 57 due to RA complications Check HIV given concern for "weak immune system" with recurrent HSV1 - keep Valtrex refills available for outbreak as needed Discussed compounding effect of anxiety as caused by frustration over lack of clear dx - pt acknowledges same and declines need for med rx to help control same  Time spent with pt today 60 minutes, greater than 50% time spent counseling patient on abdominal pain, nausea, back pain and medication review. Also review of prior records  Problem List Items Addressed This Visit   None    Visit Diagnoses   Abdominal pain, epigastric    -  Primary    Relevant Orders       NM Hepato W/EjeCT Fract       Extractable Nuclear antigen ab       ANA       Rheumatoid factor       Sedimentation rate (Completed)       Cyclic citrul peptide antibody, IgG  HIV antibody       Basic metabolic panel (Completed)       CBC with Differential (Completed)       Hepatic function panel (Completed)       TSH (Completed)    Chronic nausea        Relevant Orders       NM Hepato W/EjeCT Fract       Extractable Nuclear antigen ab       ANA       Rheumatoid factor       Sedimentation rate (Completed)       Cyclic citrul peptide antibody, IgG       HIV antibody       Basic metabolic panel (Completed)       CBC with Differential (Completed)       Hepatic function panel (Completed)       TSH (Completed)    Loss of weight        Relevant Orders       NM Hepato W/EjeCT Fract       Extractable Nuclear antigen ab       ANA       Rheumatoid factor       Sedimentation rate (Completed)       Cyclic citrul peptide antibody, IgG       HIV antibody       Basic metabolic panel (Completed)       CBC with Differential (Completed)       Hepatic function panel (Completed)       TSH (Completed)    Midline thoracic back pain        Relevant Orders       Extractable Nuclear antigen ab       ANA       Rheumatoid  factor       Sedimentation rate (Completed)       Cyclic citrul peptide antibody, IgG       HIV antibody

## 2014-04-30 LAB — EXTRACTABLE NUCLEAR ANTIGEN ANTIBODY
ENA SM Ab Ser-aCnc: <1
SM/RNP: <1
SSA (Ro) (ENA) Antibody, IgG: <1
SSB (La) (ENA) Antibody, IgG: 1.8 — ABNORMAL HIGH
Scleroderma (Scl-70) (ENA) Antibody, IgG: <1
ds DNA Ab: <1 IU/mL

## 2014-04-30 LAB — ANA: Anti Nuclear Antibody(ANA): NEGATIVE

## 2014-04-30 LAB — CYCLIC CITRUL PEPTIDE ANTIBODY, IGG: Cyclic Citrullin Peptide Ab: <2 U/mL (ref 0.0–5.0)

## 2014-04-30 LAB — HIV ANTIBODY (ROUTINE TESTING W REFLEX): HIV 1&2 Ab, 4th Generation: NONREACTIVE

## 2014-04-30 LAB — RHEUMATOID FACTOR: Rhuematoid fact SerPl-aCnc: <10 IU/mL (ref <=14)

## 2014-04-30 NOTE — Addendum Note (Signed)
Addended by: Gwendolyn Grant A on: 04/30/2014 08:48 AM   Modules accepted: Orders

## 2014-04-30 NOTE — Addendum Note (Signed)
Addended by: Gwendolyn Grant A on: 04/30/2014 01:11 PM   Modules accepted: Orders

## 2014-05-13 ENCOUNTER — Other Ambulatory Visit (INDEPENDENT_AMBULATORY_CARE_PROVIDER_SITE_OTHER): Payer: 59

## 2014-05-13 DIAGNOSIS — R945 Abnormal results of liver function studies: Secondary | ICD-10-CM

## 2014-05-13 DIAGNOSIS — R7989 Other specified abnormal findings of blood chemistry: Secondary | ICD-10-CM

## 2014-05-13 DIAGNOSIS — D751 Secondary polycythemia: Secondary | ICD-10-CM

## 2014-05-13 LAB — CBC WITH DIFFERENTIAL/PLATELET
Basophils Absolute: 0 10*3/uL (ref 0.0–0.1)
Basophils Relative: 0.5 % (ref 0.0–3.0)
Eosinophils Absolute: 0.3 10*3/uL (ref 0.0–0.7)
Eosinophils Relative: 5.3 % — ABNORMAL HIGH (ref 0.0–5.0)
HCT: 43.9 % (ref 36.0–46.0)
Hemoglobin: 14.8 g/dL (ref 12.0–15.0)
Lymphocytes Relative: 40.7 % (ref 12.0–46.0)
Lymphs Abs: 2.3 10*3/uL (ref 0.7–4.0)
MCHC: 33.6 g/dL (ref 30.0–36.0)
MCV: 87.8 fl (ref 78.0–100.0)
Monocytes Absolute: 0.4 10*3/uL (ref 0.1–1.0)
Monocytes Relative: 7.2 % (ref 3.0–12.0)
Neutro Abs: 2.7 10*3/uL (ref 1.4–7.7)
Neutrophils Relative %: 46.3 % (ref 43.0–77.0)
Platelets: 243 10*3/uL (ref 150.0–400.0)
RBC: 5 Mil/uL (ref 3.87–5.11)
RDW: 13.5 % (ref 11.5–15.5)
WBC: 5.8 10*3/uL (ref 4.0–10.5)

## 2014-05-13 LAB — HEPATIC FUNCTION PANEL
ALT: 17 U/L (ref 0–35)
AST: 20 U/L (ref 0–37)
Albumin: 4.6 g/dL (ref 3.5–5.2)
Alkaline Phosphatase: 48 U/L (ref 39–117)
Bilirubin, Direct: 0.1 mg/dL (ref 0.0–0.3)
Total Bilirubin: 0.4 mg/dL (ref 0.2–1.2)
Total Protein: 7.6 g/dL (ref 6.0–8.3)

## 2014-05-19 ENCOUNTER — Encounter (HOSPITAL_COMMUNITY): Payer: Self-pay

## 2014-05-19 ENCOUNTER — Ambulatory Visit (HOSPITAL_COMMUNITY)
Admission: RE | Admit: 2014-05-19 | Discharge: 2014-05-19 | Disposition: A | Discharge status: Home / Self Care | Payer: 59 | Source: Ambulatory Visit | Attending: Internal Medicine | Admitting: Internal Medicine

## 2014-05-19 DIAGNOSIS — R1013 Epigastric pain: Secondary | ICD-10-CM

## 2014-05-19 DIAGNOSIS — R634 Abnormal weight loss: Secondary | ICD-10-CM

## 2014-05-19 DIAGNOSIS — R1011 Right upper quadrant pain: Secondary | ICD-10-CM | POA: Insufficient documentation

## 2014-05-19 DIAGNOSIS — R110 Nausea: Secondary | ICD-10-CM

## 2014-05-19 MED ORDER — SINCALIDE 5 MCG IJ SOLR: 0.0200 ug/kg | Freq: Once | INTRAMUSCULAR | Status: DC

## 2014-05-19 MED ORDER — TECHNETIUM TC 99M MEBROFENIN IV KIT
5.5000 | PACK | Freq: Once | INTRAVENOUS | Status: AC | PRN
  Administered 2014-05-19: 5.5 via INTRAVENOUS

## 2014-05-21 NOTE — Addendum Note (Signed)
Addended by: Gwendolyn Grant A on: 05/21/2014 07:44 AM   Modules accepted: Orders

## 2014-06-10 ENCOUNTER — Ambulatory Visit (INDEPENDENT_AMBULATORY_CARE_PROVIDER_SITE_OTHER): Payer: Commercial Managed Care - PPO | Admitting: Surgery

## 2014-06-16 ENCOUNTER — Ambulatory Visit (INDEPENDENT_AMBULATORY_CARE_PROVIDER_SITE_OTHER): Payer: Commercial Managed Care - PPO | Admitting: General Surgery

## 2014-06-23 ENCOUNTER — Ambulatory Visit (INDEPENDENT_AMBULATORY_CARE_PROVIDER_SITE_OTHER): Payer: Commercial Managed Care - PPO | Admitting: General Surgery

## 2014-06-23 ENCOUNTER — Encounter (INDEPENDENT_AMBULATORY_CARE_PROVIDER_SITE_OTHER): Payer: Self-pay | Admitting: General Surgery

## 2014-06-23 VITALS — BP 122/70 | HR 74 | Temp 97.8°F | Ht 62.0 in | Wt 109.0 lb

## 2014-06-23 DIAGNOSIS — K828 Other specified diseases of gallbladder: Secondary | ICD-10-CM

## 2014-06-23 NOTE — Progress Notes (Signed)
Patient ID: Stefanie [name], female   DOB: [birth year], 48 y.o.   MRN: [id number]  Chief Complaint  Patient presents with  . Abdominal Pain    HPI Stefanie [name] is a 48 y.o. female.   Abdominal Pain Associated symptoms: nausea     She is referred by Dr. Jerrel Ivory because of postprandial epigastric pain and biliary dyskinesia.  She gets this pain in particular pizza.  She has changed her diet and lost weight because of that.  She feels bloated after eating a fat. Her sister had gallbladder disease. She has had a problem with abnormal liver function tests by report.  No jaundice or hepatitis. CT scan in 2014 showed no gallstones.  NM HIDA showed decreased GB EF and pain after CCK injection.  Past Medical History  Diagnosis Date  . Melanoma 2013    RLE, follows with derm for same  . Brain aneurysm 09/2012 dx    follows at [place] for same  . IBS (irritable bowel syndrome)   . Heart palpitations     holter 10/2013 with few PVCs/PACs, normal stress echo 12/2013  . Asthma   . Allergy   . Shingles     recurrent HSV1    Past Surgical History  Procedure Laterality Date  . Speenoidectomy    . Lasik    . Abdominal hysterectomy    . Right leg fx    . Wisdom tooth extraction      Family History  Problem Relation Age of Onset  . Rheum arthritis Mother   . Diabetes Father   . Hypertension Father   . Heart disease Father 11    MI with CABG x 5 age 40  . Esophageal cancer Paternal Aunt   . Rectal cancer Neg Hx   . Stomach cancer Neg Hx   . Colon cancer Cousin   . Pancreatic cancer Cousin   . Heart attack Brother 87    Social History History  Substance Use Topics  . Smoking status: Never Smoker   . Smokeless tobacco: Never Used  . Alcohol Use: No    Allergies  Allergen Reactions  . Morphine And Related Hives and Itching  . Coppertone Spf4 Itching  . Sulfa Antibiotics Hives and Itching    headaches    Current Outpatient Prescriptions  Medication Sig Dispense Refill   . Artificial Tear Ointment (ARTIFICIAL TEARS) ointment Place 1 drop into both eyes as needed (dry eyes).      . valACYclovir (VALTREX) 1000 MG tablet Take 1 tablet (1,000 mg total) by mouth 3 (three) times daily.  21 tablet  0   No current facility-administered medications for this visit.    Review of Systems Review of Systems  Constitutional: Positive for unexpected weight change.  HENT: Negative.   Respiratory: Negative.   Cardiovascular: Negative.   Gastrointestinal: Positive for nausea and abdominal pain.  Genitourinary: Negative.   Neurological:       Two intracranial aneurysms.  Hematological: Negative.     Blood pressure 122/70, pulse 74, temperature 97.8 F (36.6 C), height 5\' 2"  (1.575 m), weight 109 lb (49.442 kg).  Physical Exam Physical Exam  Constitutional: No distress.  Thin female.  HENT:  Head: Normocephalic and atraumatic.  Eyes: EOM are normal. No scleral icterus.  Cardiovascular: Normal rate and regular rhythm.   Pulmonary/Chest: Effort normal and breath sounds normal.  Abdominal: Soft. She exhibits no distension and no mass. There is no tenderness.  Small subumbilical scar.  Musculoskeletal: She exhibits  no edema.  Neurological: She is alert.  Skin: Skin is warm and dry.  Psychiatric: She has a normal mood and affect. Her behavior is normal.    Data Reviewed Note from Dr. Asa Lente.  HIDA scan result and CT result.  Assessment    Epigastric pain with biliary dyskinesia.  Also has two intracranial aneurysms being followed by a Neurosurgeon at [facility].     Plan    We discussed laparoscopic cholecystectomy for biliary dyskinesia and it's success rate.  She would need to be seen by her Neurosurgeon first to make sure the operation would be safe from that standpoint.  I have explained the procedure, risks, and aftercare of cholecystectomy.  Risks include but are not limited to bleeding, infection, wound problems, anesthesia, diarrhea, bile leak, injury  to common bile duct/liver/intestine.  She seems to understand and will call back after seeing the Neurosurgeon if she wants to proceed.  Strict lowfat diet recommended.         Athleen Feltner J 06/23/2014, 3:01 PM

## 2014-06-23 NOTE — Patient Instructions (Signed)
Stay with a lowfat diet.  Call us after your aneurysm follow up visit if you would like to proceed with gallbladder removal.

## 2014-07-02 ENCOUNTER — Telehealth: Payer: Self-pay | Admitting: Internal Medicine

## 2014-07-02 NOTE — Telephone Encounter (Signed)
Rec'd records from [employer] Forwarding 2 page's to [employer]

## 2014-07-03 ENCOUNTER — Other Ambulatory Visit (HOSPITAL_COMMUNITY): Payer: Self-pay | Admitting: Neurosurgery

## 2014-07-03 DIAGNOSIS — I729 Aneurysm of unspecified site: Secondary | ICD-10-CM

## 2014-07-04 ENCOUNTER — Ambulatory Visit (HOSPITAL_COMMUNITY)
Admission: RE | Admit: 2014-07-04 | Discharge: 2014-07-04 | Disposition: A | Discharge status: Home / Self Care | Payer: 59 | Source: Ambulatory Visit | Attending: Neurosurgery | Admitting: Neurosurgery

## 2014-07-04 ENCOUNTER — Other Ambulatory Visit (HOSPITAL_COMMUNITY): Payer: Self-pay | Admitting: Neurosurgery

## 2014-07-04 DIAGNOSIS — I729 Aneurysm of unspecified site: Secondary | ICD-10-CM

## 2014-07-04 DIAGNOSIS — I671 Cerebral aneurysm, nonruptured: Secondary | ICD-10-CM | POA: Diagnosis present

## 2014-07-23 ENCOUNTER — Encounter: Payer: Self-pay | Admitting: Internal Medicine

## 2014-07-23 ENCOUNTER — Ambulatory Visit (INDEPENDENT_AMBULATORY_CARE_PROVIDER_SITE_OTHER): Payer: 59 | Admitting: Internal Medicine

## 2014-07-23 VITALS — BP 118/78 | HR 51 | Temp 97.7°F | Ht 62.0 in | Wt 108.8 lb

## 2014-07-23 DIAGNOSIS — I671 Cerebral aneurysm, nonruptured: Secondary | ICD-10-CM

## 2014-07-23 DIAGNOSIS — M3500 Sicca syndrome, unspecified: Secondary | ICD-10-CM

## 2014-07-23 DIAGNOSIS — K828 Other specified diseases of gallbladder: Secondary | ICD-10-CM

## 2014-07-23 HISTORY — DX: Sjogren syndrome, unspecified: M35.00

## 2014-07-23 MED ORDER — VALACYCLOVIR HCL 1 G PO TABS: 1000.0000 mg | ORAL_TABLET | Freq: Three times a day (TID) | ORAL | Status: DC

## 2014-07-23 NOTE — Assessment & Plan Note (Signed)
MRI 07/2014 stable To follow up Nsurg @ [facility] 07/2014 as planned Support offered

## 2014-07-23 NOTE — Progress Notes (Signed)
Subjective:    Patient ID: Stefanie [name], female    DOB: [birth year], 48 y.o.   MRN: [id number]  HPI  Patient is here for follow up  Reviewed chronic medical issues and interval medical events  Past Medical History  Diagnosis Date  . Melanoma 2013    RLE, follows with derm for same  . Brain aneurysm 09/2012 dx    follows at [place] for same  . IBS (irritable bowel syndrome)   . Heart palpitations     holter 10/2013 with few PVCs/PACs, normal stress echo 12/2013  . Asthma   . Allergy   . Shingles     recurrent HSV1    Review of Systems  Constitutional: Negative for fever, fatigue and unexpected weight change.  Gastrointestinal: Positive for nausea and abdominal pain (intermittent with po intake). Negative for diarrhea and constipation.  Psychiatric/Behavioral: The patient is nervous/anxious.        Objective:   Physical Exam  BP 118/78  Pulse 51  Temp(Src) 97.7 F (36.5 C) (Oral)  Ht 5\' 2"  (1.575 m)  Wt 108 lb 12 oz (49.329 kg)  BMI 19.89 kg/m2  SpO2 99% Wt Readings from Last 3 Encounters:  07/23/14 108 lb 12 oz (49.329 kg)  06/23/14 109 lb (49.442 kg)  05/19/14 106 lb (48.081 kg)   Constitutional: She appears well-developed and well-nourished. No distress.  Neck: Normal range of motion. Neck supple. No JVD present. No thyromegaly present.  Cardiovascular: Normal rate, regular rhythm and normal heart sounds.  No murmur heard. No BLE edema. Pulmonary/Chest: Effort normal and breath sounds normal. No respiratory distress. She has no wheezes.  Psychiatric: She has a normal mood and affect. Her behavior is normal. Judgment and thought content normal.   Lab Results  Component Value Date   WBC 5.8 05/13/2014   HGB 14.8 05/13/2014   HCT 43.9 05/13/2014   PLT 243.0 05/13/2014   GLUCOSE 93 04/29/2014   CHOL 185 04/22/2012   TRIG 76 04/22/2012   HDL 61 04/22/2012   LDLCALC 109* 04/22/2012   ALT 17 05/13/2014   AST 20 05/13/2014   NA 137 04/29/2014   K 3.7 04/29/2014     CL 100 04/29/2014   CREATININE 0.7 04/29/2014   BUN 11 04/29/2014   CO2 31 04/29/2014   TSH 3.77 04/29/2014   HGBA1C 5.4 04/22/2012    Mr Jodene Nam Head Wo Contrast  07/04/2014   CLINICAL DATA:  Aneurysm.  EXAM: MRA HEAD WITHOUT CONTRAST  TECHNIQUE: Angiographic images of the Circle of Willis were obtained using MRA technique without intravenous contrast.  COMPARISON:  06/21/2013  FINDINGS: A medially directed aneurysm from the paraclinoid left ICA is stable in size, measuring up to 3 mm base to dome. There is no change in the sac morphology. The aneurysm is just beyond the ophthalmic artery origin, but distinct from this vessel ostium. 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 The finding is too small to resolve on reformatted images. No previous catheter angiography or CTA available for review. The appearance is stable. No evidence of new aneurysm.  Posterior circulation remains tortuous and hypoplastic in the setting of large bilateral posterior communicating arteries. Stable patency of the bile PICA and superior cerebellar arteries. There is no intracranial arterial stenosis.  IMPRESSION: 1. 3 mm left paraclinoid ICA aneurysm is unchanged in size from 2014. 2.  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   Electronically Signed   By: Jorje Guild M.D.   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 Also review of prior records

## 2014-07-23 NOTE — Patient Instructions (Signed)
It was good to see you today.  We have reviewed your prior records including labs and tests today  Medications reviewed and updated, no changes recommended at this time.  Continue working with your specialists as reviewed today  Please schedule followup in 6 months, call sooner if problems.

## 2014-07-23 NOTE — Assessment & Plan Note (Signed)
symptoms pain with intake eval by gen surg 05/2014 and considering lap chole if cleared for surg by Nsurg 07/2014 Continue diet mgmt of same - support offered

## 2014-07-23 NOTE — Assessment & Plan Note (Signed)
Dx clarified summer 2015 Following with rheum for same and considering Plaquenil Interval reviewed, support provided

## 2014-07-23 NOTE — Progress Notes (Signed)
Pre visit review using our clinic review tool, if applicable. No additional management support is needed unless otherwise documented below in the visit note. 

## 2014-12-16 ENCOUNTER — Telehealth: Payer: Self-pay

## 2014-12-16 NOTE — Telephone Encounter (Signed)
LVM for pt to call back.   Move the 01/21/15 appt to 12/24/14 if pt approves.

## 2015-01-21 ENCOUNTER — Ambulatory Visit: Payer: 59 | Admitting: Internal Medicine

## 2015-01-27 ENCOUNTER — Other Ambulatory Visit: Payer: Self-pay

## 2015-01-27 DIAGNOSIS — Z1231 Encounter for screening mammogram for malignant neoplasm of breast: Secondary | ICD-10-CM

## 2015-02-03 ENCOUNTER — Encounter: Payer: Self-pay | Admitting: General Surgery

## 2015-02-03 NOTE — Progress Notes (Signed)
Stefanie [name]. Bail 02/03/2015 3:01 PM Location: [facility] Patient #: [id number] DOB: 1966/09/27 Married / Language: Cleophus Molt / Race: White Female History of Present Illness Odis Hollingshead MD; 02/03/2015 7:45 PM) Patient words: gallbladder.  The patient is a 49 year old female    Note:She is here to discuss laparoscopic cholecystectomy for biliary dyskinesia. She was last seen in August of 3818 with biliary colic type symptoms and laparoscopic cholecystectomy was discussed. Her Neurosurgeon feels it okay to proceed. She has been trying to manage her symptoms with diet modification but is able to eat less because of the fullness and discomfort. She is struggling to maintain her weight because of this. Her family is very concerned about her and that is one of the reasons she is here today.  Other Problems Elbert Ewings, CMA; 02/03/2015 3:02 PM) Back Pain Melanoma Other disease, cancer, significant illness  Past Surgical History Elbert Ewings, CMA; 02/03/2015 3:02 PM) Breast Biopsy Bilateral. multiple Hysterectomy (not due to cancer) - Partial  Diagnostic Studies History Elbert Ewings, CMA; 02/03/2015 3:02 PM) Colonoscopy 5-10 years ago Mammogram 1-3 years ago Pap Smear 1-5 years ago  Allergies Elbert Ewings, CMA; 02/03/2015 3:03 PM) Morphine Sulfate (Concentrate) *ANALGESICS - OPIOID* Coppertone *DERMATOLOGICALS* Sulfa 10 *OPHTHALMIC AGENTS*  Medication History Elbert Ewings, CMA; 02/03/2015 3:03 PM) Valtrex (500MG  Tablet, Oral as needed) Active. Artificial Tears (Ophthalmic) Active. Medications Reconciled  Social History Elbert Ewings, [place]; 02/03/2015 3:02 PM) Caffeine use Coffee, Tea. No alcohol use No drug use Tobacco use Never smoker.  Family History Elbert Ewings, [place]; 02/03/2015 3:02 PM) Alcohol Abuse Brother, Family Members In General. Arthritis Brother, Mother. Cerebrovascular Accident Family Members In General, Father. Diabetes Mellitus Brother,  Father, Sister. Heart Disease Family Members In General, Father. Hypertension Brother, Sister. Malignant Neoplasm Of Pancreas Family Members In General. Prostate Cancer Family Members In General. Thyroid problems Brother, Sister.  Pregnancy / Birth History Elbert Ewings, CMA; 02/03/2015 3:02 PM) Age at menarche 34 years. Age of menopause <45 Gravida 2 Irregular periods Maternal age 22-30 Para 2     Review of Systems Elbert Ewings CMA; 02/03/2015 3:02 PM) General Present- Appetite Loss and Weight Loss. Not Present- Chills, Fatigue, Fever, Night Sweats and Weight Gain. Skin Not Present- Change in Wart/Mole, Dryness, Hives, Jaundice, New Lesions, Non-Healing Wounds, Rash and Ulcer. HEENT Present- Hearing Loss and Ringing in the Ears. Not Present- Earache, Hoarseness, Nose Bleed, Oral Ulcers, Seasonal Allergies, Sinus Pain, Sore Throat, Visual Disturbances, Wears glasses/contact lenses and Yellow Eyes. Respiratory Not Present- Bloody sputum, Chronic Cough, Difficulty Breathing, Snoring and Wheezing. Breast Not Present- Breast Mass, Breast Pain, Nipple Discharge and Skin Changes. Cardiovascular Present- Chest Pain and Palpitations. Not Present- Difficulty Breathing Lying Down, Leg Cramps, Rapid Heart Rate, Shortness of Breath and Swelling of Extremities. Gastrointestinal Present- Abdominal Pain, Bloating, Constipation, Excessive gas, Gets full quickly at meals and Nausea. Not Present- Bloody Stool, Change in Bowel Habits, Chronic diarrhea, Difficulty Swallowing, Hemorrhoids, Indigestion, Rectal Pain and Vomiting. Female Genitourinary Not Present- Frequency, Nocturia, Painful Urination, Pelvic Pain and Urgency. Musculoskeletal Present- Joint Pain and Muscle Weakness. Not Present- Back Pain, Joint Stiffness, Muscle Pain and Swelling of Extremities. Neurological Present- Decreased Memory. Not Present- Fainting, Headaches, Numbness, Seizures, Tingling, Tremor, Trouble walking and  Weakness. Psychiatric Not Present- Anxiety, Bipolar, Change in Sleep Pattern, Depression, Fearful and Frequent crying. Endocrine Present- Cold Intolerance, Hair Changes and Hot flashes. Not Present- Excessive Hunger, Heat Intolerance and New Diabetes. Hematology Not Present- Easy Bruising, Excessive bleeding, Gland problems, HIV and Persistent  Infections.  Vitals Elbert Ewings CMA; 02/03/2015 3:04 PM) 02/03/2015 3:03 PM Weight: 111 lb Height: 62in Body Surface Area: 1.48 m Body Mass Index: 20.3 kg/m Temp.: 97.21F(Oral)  Pulse: 72 (Regular)  Resp.: 17 (Unlabored)  BP: 122/80 (Sitting, Left Arm, Standard)     Physical Exam Odis Hollingshead MD; 02/03/2015 7:48 PM)  The physical exam findings are as follows: Note:General: Thin female in NAD. Pleasant and cooperative.  EYES: EOMI, no icterus  ABDOMEN: Soft, nontender, nondistended, no masses.  NEUROLOGIC: Alert and oriented, answers questions appropriately.  PSYCHIATRIC: Normal mood, affect , and behavior.    Assessment & Plan Odis Hollingshead MD; 02/03/2015 7:49 PM)  BILIARY DYSKINESIA (575.8  K82.8) Impression: Her symptoms appear to be worsening. She is now wanting to schedule surgery. She may need to wait until June when her husband will be available to help her postop.  Plan: Laparoscopic cholecystectomy with IOC. I have explained the procedure, risks, and aftercare of cholecystectomy. Risks include but are not limited to bleeding, infection, wound problems, anesthesia, diarrhea, bile leak, injury to common bile duct/liver/intestine. She seems to understand and agrees to proceed.  Current Plans Follow up as needed Schedule for Surgery  Jackolyn Confer, MD

## 2015-02-05 ENCOUNTER — Ambulatory Visit: Payer: 59 | Admitting: Cardiovascular Disease

## 2015-03-02 ENCOUNTER — Ambulatory Visit
Admission: RE | Admit: 2015-03-02 | Discharge: 2015-03-02 | Disposition: A | Discharge status: Home / Self Care | Payer: 59 | Source: Ambulatory Visit

## 2015-03-02 DIAGNOSIS — Z1231 Encounter for screening mammogram for malignant neoplasm of breast: Secondary | ICD-10-CM

## 2015-03-24 ENCOUNTER — Other Ambulatory Visit: Payer: Self-pay | Admitting: Obstetrics & Gynecology

## 2015-03-25 LAB — CYTOLOGY - PAP

## 2015-04-01 HISTORY — PX: GALLBLADDER SURGERY: SHX652

## 2015-04-02 ENCOUNTER — Encounter (HOSPITAL_COMMUNITY): Payer: Self-pay

## 2015-04-02 ENCOUNTER — Other Ambulatory Visit (HOSPITAL_COMMUNITY): Payer: 59

## 2015-04-02 ENCOUNTER — Encounter (HOSPITAL_COMMUNITY)
Admission: RE | Admit: 2015-04-02 | Discharge: 2015-04-02 | Disposition: A | Discharge status: Home / Self Care | Payer: 59 | Source: Ambulatory Visit | Attending: General Surgery | Admitting: General Surgery

## 2015-04-02 DIAGNOSIS — J45909 Unspecified asthma, uncomplicated: Secondary | ICD-10-CM | POA: Diagnosis not present

## 2015-04-02 DIAGNOSIS — K819 Cholecystitis, unspecified: Secondary | ICD-10-CM | POA: Diagnosis not present

## 2015-04-02 DIAGNOSIS — Z91048 Other nonmedicinal substance allergy status: Secondary | ICD-10-CM | POA: Diagnosis not present

## 2015-04-02 DIAGNOSIS — M3500 Sicca syndrome, unspecified: Secondary | ICD-10-CM | POA: Diagnosis not present

## 2015-04-02 DIAGNOSIS — Z91040 Latex allergy status: Secondary | ICD-10-CM | POA: Diagnosis not present

## 2015-04-02 DIAGNOSIS — K828 Other specified diseases of gallbladder: Secondary | ICD-10-CM | POA: Diagnosis present

## 2015-04-02 DIAGNOSIS — Z885 Allergy status to narcotic agent status: Secondary | ICD-10-CM | POA: Diagnosis not present

## 2015-04-02 DIAGNOSIS — Z887 Allergy status to serum and vaccine status: Secondary | ICD-10-CM | POA: Diagnosis not present

## 2015-04-02 DIAGNOSIS — I739 Peripheral vascular disease, unspecified: Secondary | ICD-10-CM | POA: Diagnosis not present

## 2015-04-02 DIAGNOSIS — K589 Irritable bowel syndrome without diarrhea: Secondary | ICD-10-CM | POA: Diagnosis not present

## 2015-04-02 DIAGNOSIS — Z85820 Personal history of malignant melanoma of skin: Secondary | ICD-10-CM | POA: Diagnosis not present

## 2015-04-02 LAB — CBC WITH DIFFERENTIAL/PLATELET
Basophils Absolute: 0 10*3/uL (ref 0.0–0.1)
Basophils Relative: 1 % (ref 0–1)
Eosinophils Absolute: 0.1 10*3/uL (ref 0.0–0.7)
Eosinophils Relative: 2 % (ref 0–5)
HCT: 39.5 % (ref 36.0–46.0)
Hemoglobin: 14 g/dL (ref 12.0–15.0)
Lymphocytes Relative: 48 % — ABNORMAL HIGH (ref 12–46)
Lymphs Abs: 3.1 10*3/uL (ref 0.7–4.0)
MCH: 29.7 pg (ref 26.0–34.0)
MCHC: 35.4 g/dL (ref 30.0–36.0)
MCV: 83.9 fL (ref 78.0–100.0)
Monocytes Absolute: 0.4 10*3/uL (ref 0.1–1.0)
Monocytes Relative: 6 % (ref 3–12)
Neutro Abs: 2.8 10*3/uL (ref 1.7–7.7)
Neutrophils Relative %: 43 % (ref 43–77)
Platelets: 234 10*3/uL (ref 150–400)
RBC: 4.71 MIL/uL (ref 3.87–5.11)
RDW: 12.9 % (ref 11.5–15.5)
WBC: 6.5 10*3/uL (ref 4.0–10.5)

## 2015-04-02 LAB — COMPREHENSIVE METABOLIC PANEL
ALT: 18 U/L (ref 14–54)
AST: 23 U/L (ref 15–41)
Albumin: 4.5 g/dL (ref 3.5–5.0)
Alkaline Phosphatase: 50 U/L (ref 38–126)
Anion gap: 8 (ref 5–15)
BUN: 8 mg/dL (ref 6–20)
CO2: 27 mmol/L (ref 22–32)
Calcium: 9.6 mg/dL (ref 8.9–10.3)
Chloride: 106 mmol/L (ref 101–111)
Creatinine, Ser: 0.75 mg/dL (ref 0.44–1.00)
GFR calc Af Amer: >60 mL/min (ref >60)
GFR calc non Af Amer: >60 mL/min (ref >60)
Glucose, Bld: 120 mg/dL — ABNORMAL HIGH (ref 65–99)
Potassium: 3.7 mmol/L (ref 3.5–5.1)
Sodium: 141 mmol/L (ref 135–145)
Total Bilirubin: 0.3 mg/dL (ref 0.3–1.2)
Total Protein: 7.5 g/dL (ref 6.5–8.1)

## 2015-04-02 LAB — PROTIME-INR
INR: 1.05 (ref 0.00–1.49)
Prothrombin Time: 13.9 seconds (ref 11.6–15.2)

## 2015-04-02 NOTE — Pre-Procedure Instructions (Signed)
    Stefanie [name]  04/02/2015      [facility] - [place], [place] [address] [place] [address] Phone: [phone] Fax: [phone]    Your procedure is scheduled on April 10, 2015.  Report to [facility] Admitting at 9:45 A.M.  Call this number if you have problems the morning of surgery:  [phone]   Remember:  Do not eat food or drink liquids after midnight.  Take these medicines the morning of surgery with A SIP OF WATER : none   Do not wear jewelry, make-up or nail polish.  Do not wear lotions, powders, or perfumes.  You may wear deodorant.  Do not shave 48 hours prior to surgery.   Do not bring valuables to the hospital.  [facility] is not responsible for any belongings or valuables.  Contacts, dentures or bridgework may not be worn into surgery.  Leave your suitcase in the car.  After surgery it may be brought to your room.  For patients admitted to the hospital, discharge time will be determined by your treatment team.  Patients discharged the day of surgery will not be allowed to drive home.   Name and phone number of your driver:   Family/friend Special instructions:  "PREPARING FOR SURGERY"  Please read over the following fact sheets that you were given. Pain Booklet, Coughing and Deep Breathing and Surgical Site Infection Prevention

## 2015-04-03 NOTE — Progress Notes (Signed)
Pt states she has had shingles 3x now. Pt instructed to call [phone] or office if outbreak of shingles reoccur before day of surgery.

## 2015-04-05 ENCOUNTER — Emergency Department
Admission: EM | Admit: 2015-04-05 | Discharge: 2015-04-05 | Disposition: A | Discharge status: Home / Self Care | Payer: 59 | Source: Home / Self Care | Attending: Emergency Medicine | Admitting: Emergency Medicine

## 2015-04-05 DIAGNOSIS — L089 Local infection of the skin and subcutaneous tissue, unspecified: Secondary | ICD-10-CM | POA: Diagnosis not present

## 2015-04-05 MED ORDER — DOXYCYCLINE HYCLATE 100 MG PO CAPS: 100.0000 mg | ORAL_CAPSULE | Freq: Two times a day (BID) | ORAL | Status: DC

## 2015-04-05 NOTE — ED Provider Notes (Signed)
CSN: [id number]     Arrival date & time 04/05/15  1636 History   First MD Initiated Contact with Patient 04/05/15 1704     Chief Complaint  Patient presents with  . Toe Pain   (Consider location/radiation/quality/duration/timing/severity/associated sxs/prior Treatment) Patient is a 49 y.o. female presenting with toe pain. The history is provided by the patient. No language interpreter was used.  Toe Pain This is a new problem. The current episode started more than 2 days ago. The problem occurs constantly. The problem has been gradually worsening. Pertinent negatives include no shortness of breath. Nothing aggravates the symptoms. Nothing relieves the symptoms. She has tried nothing for the symptoms. The treatment provided no relief.  Pt reports she has a blister on her toe.   Pt is having pain and redness.  Pt scheduled for gallbladder surgery on Friday and is worried about infection  Past Medical History  Diagnosis Date  . Melanoma 2013    RLE, follows with derm for same  . IBS (irritable bowel syndrome)   . Heart palpitations     holter 10/2013 with few PVCs/PACs, normal stress echo 12/2013  . Allergy   . Shingles     recurrent HSV1  . Sjogren's syndrome 07/23/2014    Dx summer 2015 -follows with rheum for same  . Brain aneurysm 09/2012 dx    follows at [place] for same  . Asthma     "ASTHMATIC AIRWAY DISEASE", NO INHALER   Past Surgical History  Procedure Laterality Date  . Speenoidectomy    . Lasik    . Abdominal hysterectomy    . Right leg fx    . Wisdom tooth extraction    . Breast biopsy Left   . Colonoscopy     Family History  Problem Relation Age of Onset  . Rheum arthritis Mother   . Diabetes Father   . Hypertension Father   . Heart disease Father 4    MI with CABG x 5 age 46  . Esophageal cancer Paternal Aunt   . Rectal cancer Neg Hx   . Stomach cancer Neg Hx   . Colon cancer Cousin   . Pancreatic cancer Cousin   . Heart attack Brother 50   History   Substance Use Topics  . Smoking status: Never Smoker   . Smokeless tobacco: Never Used  . Alcohol Use: No   OB History    No data available     Review of Systems  Respiratory: Negative for shortness of breath.   All other systems reviewed and are negative.   Allergies  Morphine and related; Coppertone spf4; Sulfa antibiotics; and Latex  Home Medications   Prior to Admission medications   Medication Sig Start Date End Date Taking? Authorizing Provider  omega-3 acid ethyl esters (LOVAZA) 1 G capsule Take 1 g by mouth daily.   Yes Historical Provider, MD  Polyethyl Glycol-Propyl Glycol 0.4-0.3 % SOLN Place 3 drops into both eyes 3 (three) times daily as needed (dry eyes). Systane   Yes Historical Provider, MD  valACYclovir (VALTREX) 1000 MG tablet Take 1 tablet (1,000 mg total) by mouth 3 (three) times daily. Patient taking differently: Take 1,000 mg by mouth 3 (three) times daily as needed (break out).  07/23/14  Yes Rowe Clack, MD  doxycycline (VIBRAMYCIN) 100 MG capsule Take 1 capsule (100 mg total) by mouth 2 (two) times daily. 04/05/15   Fransico Meadow, PA-C   BP 115/74 mmHg  Pulse 79  Temp(Src)  98.3 F (36.8 C) (Oral)  Ht 5\' 2"  (1.575 m)  Wt 111 lb (50.349 kg)  BMI 20.30 kg/m2  SpO2 98% Physical Exam  Constitutional: She appears well-developed and well-nourished.  HENT:  Head: Normocephalic.  Musculoskeletal: She exhibits tenderness.  Blister 2nd toe,  Erythema round area  approx 1cm.    Neurological: She is alert.  Skin: Skin is warm.  Psychiatric: She has a normal mood and affect.  Nursing note and vitals reviewed.   ED Course  Procedures (including critical care time) Labs Review Labs Reviewed  WOUND CULTURE    Imaging Review No results found.   MDM  Culture obtained.   I will start on doxycycline to cover for MRSa.  Pt advised to let her surgeon know about infection AVS    1. Skin infection        Fransico Meadow, PA-C 04/05/15 1737

## 2015-04-05 NOTE — Discharge Instructions (Signed)
Wound Infection °A wound infection happens when a type of germ (bacteria) starts growing in the wound. In some cases, this can cause the wound to break open. If cared for properly, the infected wound will heal from the inside to the outside. Wound infections need treatment. °CAUSES °An infection is caused by bacteria growing in the wound.  °SYMPTOMS  °· Increase in redness, swelling, or pain at the wound site. °· Increase in drainage at the wound site. °· Wound or bandage (dressing) starts to smell bad. °· Fever. °· Feeling tired or fatigued. °· Pus draining from the wound. °TREATMENT  °Your health care provider will prescribe antibiotic medicine. The wound infection should improve within 24 to 48 hours. Any redness around the wound should stop spreading and the wound should be less painful.  °HOME CARE INSTRUCTIONS  °· Only take over-the-counter or prescription medicines for pain, discomfort, or fever as directed by your health care provider. °· Take your antibiotics as directed. Finish them even if you start to feel better. °· Gently wash the area with mild soap and water 2 times a day, or as directed. Rinse off the soap. Pat the area dry with a clean towel. Do not rub the wound. This may cause bleeding. °· Follow your health care provider's instructions for how often you need to change the dressing. °· Apply ointment and a dressing to the wound as directed. °· If the dressing sticks, moisten it with soapy water and gently remove it. °· Change the bandage right away if it becomes wet, dirty, or develops a bad smell. °· Take showers. Do not take tub baths, swim, or do anything that may soak the wound until it is healed. °· Avoid exercises that make you sweat heavily. °· Use anti-itch medicine as directed by your health care provider. The wound may itch when it is healing. Do not pick or scratch at the wound. °· Follow up with your health care provider to get your wound rechecked as directed. °SEEK MEDICAL CARE  IF: °· You have an increase in swelling, pain, or redness around the wound. °· You have an increase in the amount of pus coming from the wound. °· There is a bad smell coming from the wound. °· More of the wound breaks open. °· You have a fever. °MAKE SURE YOU:  °· Understand these instructions. °· Will watch your condition. °· Will get help right away if you are not doing well or get worse. °Document Released: 07/16/2003 Document Revised: 10/22/2013 Document Reviewed: 02/20/2011 °ExitCare® Patient Information ©2015 [employer]. This information is not intended to replace advice given to you by your health care provider. Make sure you discuss any questions you have with your health care provider. ° °

## 2015-04-05 NOTE — ED Notes (Signed)
Patient c/o of blister on second toe of left foot. Patient states that a shoe rubbed a blister on her toe and she feels it may be infected.

## 2015-04-06 LAB — HM PAP SMEAR

## 2015-04-08 ENCOUNTER — Telehealth: Payer: Self-pay | Admitting: *Deleted

## 2015-04-08 LAB — WOUND CULTURE
Gram Stain: NONE SEEN
Gram Stain: NONE SEEN

## 2015-04-09 MED ORDER — CEFAZOLIN SODIUM-DEXTROSE 2-3 GM-% IV SOLR
2.0000 g | INTRAVENOUS | Status: DC
  Filled 2015-04-09: qty 50

## 2015-04-10 ENCOUNTER — Ambulatory Visit (HOSPITAL_COMMUNITY)
Admission: RE | Admit: 2015-04-10 | Discharge: 2015-04-11 | Disposition: A | Discharge status: Home / Self Care | Payer: 59 | Source: Ambulatory Visit | Attending: General Surgery | Admitting: General Surgery

## 2015-04-10 ENCOUNTER — Ambulatory Visit (HOSPITAL_COMMUNITY): Payer: 59 | Admitting: Anesthesiology

## 2015-04-10 ENCOUNTER — Encounter (HOSPITAL_COMMUNITY)
Admission: RE | Disposition: A | Discharge status: Home / Self Care | Payer: Self-pay | Source: Ambulatory Visit | Attending: General Surgery

## 2015-04-10 ENCOUNTER — Ambulatory Visit (HOSPITAL_COMMUNITY): Payer: 59

## 2015-04-10 DIAGNOSIS — K828 Other specified diseases of gallbladder: Secondary | ICD-10-CM

## 2015-04-10 DIAGNOSIS — K819 Cholecystitis, unspecified: Secondary | ICD-10-CM | POA: Diagnosis not present

## 2015-04-10 DIAGNOSIS — Z885 Allergy status to narcotic agent status: Secondary | ICD-10-CM | POA: Insufficient documentation

## 2015-04-10 DIAGNOSIS — Z91040 Latex allergy status: Secondary | ICD-10-CM | POA: Insufficient documentation

## 2015-04-10 DIAGNOSIS — J45909 Unspecified asthma, uncomplicated: Secondary | ICD-10-CM | POA: Insufficient documentation

## 2015-04-10 DIAGNOSIS — I739 Peripheral vascular disease, unspecified: Secondary | ICD-10-CM | POA: Insufficient documentation

## 2015-04-10 DIAGNOSIS — Z91048 Other nonmedicinal substance allergy status: Secondary | ICD-10-CM | POA: Insufficient documentation

## 2015-04-10 DIAGNOSIS — Z85820 Personal history of malignant melanoma of skin: Secondary | ICD-10-CM | POA: Insufficient documentation

## 2015-04-10 DIAGNOSIS — M3500 Sicca syndrome, unspecified: Secondary | ICD-10-CM | POA: Insufficient documentation

## 2015-04-10 DIAGNOSIS — K589 Irritable bowel syndrome without diarrhea: Secondary | ICD-10-CM | POA: Insufficient documentation

## 2015-04-10 DIAGNOSIS — Z887 Allergy status to serum and vaccine status: Secondary | ICD-10-CM | POA: Insufficient documentation

## 2015-04-10 HISTORY — PX: CHOLECYSTECTOMY: SHX55

## 2015-04-10 SURGERY — LAPAROSCOPIC CHOLECYSTECTOMY WITH INTRAOPERATIVE CHOLANGIOGRAM
Anesthesia: General | Site: Abdomen

## 2015-04-10 MED ORDER — OXYCODONE HCL 5 MG PO TABS
ORAL_TABLET | ORAL | Status: AC
  Administered 2015-04-10: 10 mg via ORAL
  Filled 2015-04-10: qty 2

## 2015-04-10 MED ORDER — HYDROMORPHONE HCL 1 MG/ML IJ SOLN
INTRAMUSCULAR | Status: AC
  Administered 2015-04-10: 0.5 mg via INTRAVENOUS
  Filled 2015-04-10: qty 1

## 2015-04-10 MED ORDER — PROPOFOL 10 MG/ML IV BOLUS
INTRAVENOUS | Status: DC | PRN
  Administered 2015-04-10: 120 mg via INTRAVENOUS

## 2015-04-10 MED ORDER — HYDROMORPHONE HCL 1 MG/ML IJ SOLN: 0.5000 mg | INTRAMUSCULAR | Status: DC | PRN

## 2015-04-10 MED ORDER — OXYCODONE HCL 5 MG PO TABS: 5.0000 mg | ORAL_TABLET | ORAL | Status: DC | PRN

## 2015-04-10 MED ORDER — ACETAMINOPHEN 325 MG PO TABS
650.0000 mg | ORAL_TABLET | ORAL | Status: DC | PRN
  Filled 2015-04-10: qty 2

## 2015-04-10 MED ORDER — ONDANSETRON HCL 4 MG PO TABS: 4.0000 mg | ORAL_TABLET | ORAL | Status: DC | PRN

## 2015-04-10 MED ORDER — PROMETHAZINE HCL 25 MG/ML IJ SOLN
INTRAMUSCULAR | Status: AC
  Filled 2015-04-10: qty 1

## 2015-04-10 MED ORDER — ONDANSETRON HCL 4 MG PO TABS: 4.0000 mg | ORAL_TABLET | Freq: Four times a day (QID) | ORAL | Status: DC | PRN

## 2015-04-10 MED ORDER — DEXTROSE IN LACTATED RINGERS 5 % IV SOLN
INTRAVENOUS | Status: DC
Start: 2015-04-10 — End: 2015-04-11
  Administered 2015-04-10 – 2015-04-11 (×2): via INTRAVENOUS

## 2015-04-10 MED ORDER — DEXTROSE IN LACTATED RINGERS 5 % IV SOLN: INTRAVENOUS | Status: DC

## 2015-04-10 MED ORDER — MIDAZOLAM HCL 2 MG/2ML IJ SOLN
INTRAMUSCULAR | Status: AC
  Filled 2015-04-10: qty 2

## 2015-04-10 MED ORDER — DEXAMETHASONE SODIUM PHOSPHATE 4 MG/ML IJ SOLN
INTRAMUSCULAR | Status: DC | PRN
  Administered 2015-04-10: 4 mg via INTRAVENOUS

## 2015-04-10 MED ORDER — HYDROMORPHONE HCL 1 MG/ML IJ SOLN
0.2500 mg | INTRAMUSCULAR | Status: DC | PRN
  Administered 2015-04-10 (×2): 0.5 mg via INTRAVENOUS
  Administered 2015-04-10: 1 mg via INTRAVENOUS

## 2015-04-10 MED ORDER — FENTANYL CITRATE (PF) 250 MCG/5ML IJ SOLN
INTRAMUSCULAR | Status: AC
  Filled 2015-04-10: qty 5

## 2015-04-10 MED ORDER — SCOPOLAMINE 1 MG/3DAYS TD PT72
1.0000 | MEDICATED_PATCH | TRANSDERMAL | Status: DC
  Administered 2015-04-10: 1.5 mg via TRANSDERMAL
  Filled 2015-04-10: qty 1

## 2015-04-10 MED ORDER — FENTANYL CITRATE (PF) 100 MCG/2ML IJ SOLN: 25.0000 ug | INTRAMUSCULAR | Status: DC | PRN

## 2015-04-10 MED ORDER — BUPIVACAINE-EPINEPHRINE 0.25% -1:200000 IJ SOLN
INTRAMUSCULAR | Status: DC | PRN
  Administered 2015-04-10: 18 mL

## 2015-04-10 MED ORDER — FENTANYL CITRATE (PF) 250 MCG/5ML IJ SOLN
INTRAMUSCULAR | Status: DC | PRN
  Administered 2015-04-10: 50 ug via INTRAVENOUS
  Administered 2015-04-10: 100 ug via INTRAVENOUS

## 2015-04-10 MED ORDER — HYDROMORPHONE HCL 1 MG/ML IJ SOLN
INTRAMUSCULAR | Status: AC
  Administered 2015-04-10: 1 mg via INTRAVENOUS
  Filled 2015-04-10: qty 1

## 2015-04-10 MED ORDER — 0.9 % SODIUM CHLORIDE (POUR BTL) OPTIME
TOPICAL | Status: DC | PRN
  Administered 2015-04-10: 1000 mL

## 2015-04-10 MED ORDER — DOXYCYCLINE HYCLATE 100 MG PO TABS
100.0000 mg | ORAL_TABLET | Freq: Two times a day (BID) | ORAL | Status: DC
  Administered 2015-04-10 – 2015-04-11 (×2): 100 mg via ORAL
  Filled 2015-04-10 (×4): qty 1

## 2015-04-10 MED ORDER — EPHEDRINE SULFATE 50 MG/ML IJ SOLN
INTRAMUSCULAR | Status: DC | PRN
  Administered 2015-04-10: 15 mg via INTRAVENOUS
  Administered 2015-04-10: 10 mg via INTRAVENOUS

## 2015-04-10 MED ORDER — SODIUM CHLORIDE 0.9 % IV SOLN
INTRAVENOUS | Status: DC | PRN
  Administered 2015-04-10: 14 mL

## 2015-04-10 MED ORDER — NEOSTIGMINE METHYLSULFATE 10 MG/10ML IV SOLN
INTRAVENOUS | Status: DC | PRN
  Administered 2015-04-10: 3 mg via INTRAVENOUS

## 2015-04-10 MED ORDER — ROCURONIUM BROMIDE 100 MG/10ML IV SOLN
INTRAVENOUS | Status: DC | PRN
  Administered 2015-04-10: 40 mg via INTRAVENOUS

## 2015-04-10 MED ORDER — GLYCOPYRROLATE 0.2 MG/ML IJ SOLN
INTRAMUSCULAR | Status: DC | PRN
  Administered 2015-04-10: .4 mg via INTRAVENOUS

## 2015-04-10 MED ORDER — LIDOCAINE HCL (CARDIAC) 20 MG/ML IV SOLN
INTRAVENOUS | Status: DC | PRN
  Administered 2015-04-10: 40 mg via INTRAVENOUS

## 2015-04-10 MED ORDER — SODIUM CHLORIDE 0.9 % IV SOLN: 250.0000 mL | INTRAVENOUS | Status: DC | PRN

## 2015-04-10 MED ORDER — OXYCODONE HCL 5 MG PO TABS
5.0000 mg | ORAL_TABLET | ORAL | Status: DC | PRN
  Administered 2015-04-10: 10 mg via ORAL

## 2015-04-10 MED ORDER — SODIUM CHLORIDE 0.9 % IJ SOLN: 3.0000 mL | INTRAMUSCULAR | Status: DC | PRN

## 2015-04-10 MED ORDER — PROMETHAZINE HCL 25 MG/ML IJ SOLN
6.2500 mg | INTRAMUSCULAR | Status: DC | PRN
  Administered 2015-04-10: 6.25 mg via INTRAVENOUS

## 2015-04-10 MED ORDER — MIDAZOLAM HCL 5 MG/5ML IJ SOLN
INTRAMUSCULAR | Status: DC | PRN
  Administered 2015-04-10: 2 mg via INTRAVENOUS

## 2015-04-10 MED ORDER — SODIUM CHLORIDE 0.9 % IJ SOLN: 3.0000 mL | Freq: Two times a day (BID) | INTRAMUSCULAR | Status: DC

## 2015-04-10 MED ORDER — BUPIVACAINE-EPINEPHRINE (PF) 0.25% -1:200000 IJ SOLN
INTRAMUSCULAR | Status: AC
  Filled 2015-04-10: qty 30

## 2015-04-10 MED ORDER — ACETAMINOPHEN 650 MG RE SUPP
650.0000 mg | RECTAL | Status: DC | PRN
  Filled 2015-04-10: qty 1

## 2015-04-10 MED ORDER — ONDANSETRON HCL 4 MG/2ML IJ SOLN: 4.0000 mg | INTRAMUSCULAR | Status: DC | PRN

## 2015-04-10 MED ORDER — SODIUM CHLORIDE 0.9 % IR SOLN
Status: DC | PRN
  Administered 2015-04-10: 1000 mL

## 2015-04-10 MED ORDER — ONDANSETRON HCL 4 MG/2ML IJ SOLN
INTRAMUSCULAR | Status: DC | PRN
  Administered 2015-04-10: 4 mg via INTRAVENOUS

## 2015-04-10 MED ORDER — OXYCODONE-ACETAMINOPHEN 5-325 MG PO TABS
1.0000 | ORAL_TABLET | ORAL | Status: DC | PRN
  Administered 2015-04-10 – 2015-04-11 (×2): 1 via ORAL
  Filled 2015-04-10 (×2): qty 1

## 2015-04-10 MED ORDER — LACTATED RINGERS IV SOLN
INTRAVENOUS | Status: DC
  Administered 2015-04-10 (×2): via INTRAVENOUS

## 2015-04-10 SURGICAL SUPPLY — 49 items
APL SKNCLS STERI-STRIP NONHPOA (GAUZE/BANDAGES/DRESSINGS) ×1
APPLIER CLIP 5 13 M/L LIGAMAX5 (MISCELLANEOUS) ×2
APR CLP MED LRG 5 ANG JAW (MISCELLANEOUS) ×1
BAG SPEC RTRVL LRG 6X4 10 (ENDOMECHANICALS) ×1
BENZOIN TINCTURE PRP APPL 2/3 (GAUZE/BANDAGES/DRESSINGS) ×2 IMPLANT
CANISTER SUCTION 2500CC (MISCELLANEOUS) ×2 IMPLANT
CATH REDDICK CHOLANGI 4FR 50CM (CATHETERS) ×1 IMPLANT
CHLORAPREP W/TINT 26ML (MISCELLANEOUS) ×2 IMPLANT
CLIP APPLIE 5 13 M/L LIGAMAX5 (MISCELLANEOUS) ×1 IMPLANT
COVER MAYO STAND STRL (DRAPES) ×2 IMPLANT
COVER SURGICAL LIGHT HANDLE (MISCELLANEOUS) ×2 IMPLANT
DECANTER SPIKE VIAL GLASS SM (MISCELLANEOUS) ×2 IMPLANT
DRAPE C-ARM 42X72 X-RAY (DRAPES) ×2 IMPLANT
DRAPE LAPAROSCOPIC ABDOMINAL (DRAPES) ×1 IMPLANT
DRSG TEGADERM 2-3/8X2-3/4 SM (GAUZE/BANDAGES/DRESSINGS) ×8 IMPLANT
ELECT REM PT RETURN 9FT ADLT (ELECTROSURGICAL) ×2
ELECTRODE REM PT RTRN 9FT ADLT (ELECTROSURGICAL) ×1 IMPLANT
GAUZE SPONGE 2X2 8PLY STRL LF (GAUZE/BANDAGES/DRESSINGS) ×1 IMPLANT
GLOVE BIOGEL PI IND STRL 7.0 (GLOVE) IMPLANT
GLOVE BIOGEL PI IND STRL 8 (GLOVE) ×1 IMPLANT
GLOVE BIOGEL PI INDICATOR 7.0 (GLOVE) ×1
GLOVE BIOGEL PI INDICATOR 8 (GLOVE) ×1
GLOVE ECLIPSE 8.0 STRL XLNG CF (GLOVE) ×1 IMPLANT
GLOVE SURG SS PI 6.5 STRL IVOR (GLOVE) ×1 IMPLANT
GLOVE SURG SS PI 7.0 STRL IVOR (GLOVE) ×1 IMPLANT
GLOVE SURG SS PI 8.0 STRL IVOR (GLOVE) ×1 IMPLANT
GOWN STRL REUS W/ TWL LRG LVL3 (GOWN DISPOSABLE) ×3 IMPLANT
GOWN STRL REUS W/TWL LRG LVL3 (GOWN DISPOSABLE) ×8
IV CATH 14GX2 1/4 (CATHETERS) ×1 IMPLANT
KIT BASIN OR (CUSTOM PROCEDURE TRAY) ×2 IMPLANT
KIT ROOM TURNOVER OR (KITS) ×2 IMPLANT
NS IRRIG 1000ML POUR BTL (IV SOLUTION) ×2 IMPLANT
PAD ARMBOARD 7.5X6 YLW CONV (MISCELLANEOUS) ×4 IMPLANT
POUCH SPECIMEN RETRIEVAL 10MM (ENDOMECHANICALS) ×2 IMPLANT
SCISSORS LAP 5X35 DISP (ENDOMECHANICALS) ×2 IMPLANT
SET CHOLANGIOGRAPH 5 50 .035 (SET/KITS/TRAYS/PACK) ×1 IMPLANT
SET IRRIG TUBING LAPAROSCOPIC (IRRIGATION / IRRIGATOR) ×2 IMPLANT
SLEEVE ENDOPATH XCEL 5M (ENDOMECHANICALS) ×4 IMPLANT
SPECIMEN JAR SMALL (MISCELLANEOUS) ×2 IMPLANT
SPONGE GAUZE 2X2 STER 10/PKG (GAUZE/BANDAGES/DRESSINGS) ×1
STRIP CLOSURE SKIN 1/2X4 (GAUZE/BANDAGES/DRESSINGS) ×1 IMPLANT
SUT MON AB 4-0 PC3 18 (SUTURE) ×2 IMPLANT
TOWEL OR 17X24 6PK STRL BLUE (TOWEL DISPOSABLE) ×2 IMPLANT
TOWEL OR 17X26 10 PK STRL BLUE (TOWEL DISPOSABLE) ×1 IMPLANT
TRAY LAPAROSCOPIC (CUSTOM PROCEDURE TRAY) ×2 IMPLANT
TROCAR XCEL BLUNT TIP 100MML (ENDOMECHANICALS) ×2 IMPLANT
TROCAR XCEL NON-BLD 11X100MML (ENDOMECHANICALS) IMPLANT
TROCAR XCEL NON-BLD 5MMX100MML (ENDOMECHANICALS) ×2 IMPLANT
TUBING INSUFFLATION (TUBING) ×2 IMPLANT

## 2015-04-10 NOTE — Interval H&P Note (Signed)
History and Physical Interval Note:  04/10/2015 11:02 AM  Stefanie [name]  has presented today for surgery, with the diagnosis of BILIARY DYSKINESIA  The various methods of treatment have been discussed with the patient and family. After consideration of risks, benefits and other options for treatment, the patient has consented to  Procedure(s): LAPAROSCOPIC CHOLECYSTECTOMY WITH INTRAOPERATIVE CHOLANGIOGRAM (N/A) as a surgical intervention .  The patient's history has been reviewed, patient examined, no change in status, stable for surgery.  I have reviewed the patient's chart and labs.  Questions were answered to the patient's satisfaction.     [name] Lenna Sciara

## 2015-04-10 NOTE — Transfer of Care (Signed)
Immediate Anesthesia Transfer of Care Note  Patient: Stefanie [name]  Procedure(s) Performed: Procedure(s): LAPAROSCOPIC CHOLECYSTECTOMY WITH INTRAOPERATIVE CHOLANGIOGRAM (N/A)  Patient Location: PACU  Anesthesia Type:General  Level of Consciousness: awake, alert  and oriented  Airway & Oxygen Therapy: Patient Spontanous Breathing and Patient connected to nasal cannula oxygen  Post-op Assessment: Report given to RN, Post -op Vital signs reviewed and stable and Patient moving all extremities X 4  Post vital signs: Reviewed and stable  Last Vitals:  Filed Vitals:   04/10/15 1015  BP: 116/68  Pulse: 56  Temp: 36.4 C  Resp: 18    Complications: No apparent anesthesia complications

## 2015-04-10 NOTE — Anesthesia Procedure Notes (Signed)
Performed by: Tamala Fothergill S Intubation Type: IV induction Ventilation: Mask ventilation without difficulty Laryngoscope Size: Mac and 3 Grade View: Grade I Tube type: Oral Tube size: 7.0 mm Number of attempts: 1 Placement Confirmation: ETT inserted through vocal cords under direct vision,  breath sounds checked- equal and bilateral and positive ETCO2 Tube secured with: Tape Dental Injury: Teeth and Oropharynx as per pre-operative assessment

## 2015-04-10 NOTE — Progress Notes (Signed)
Patient ambulated 100 ft with standby assist. Patient returned to bed.  Tolerated well.  Call bell within reach.  RN will continue to monitor

## 2015-04-10 NOTE — Anesthesia Preprocedure Evaluation (Addendum)
Anesthesia Evaluation  Patient identified by MRN, date of birth, ID band Patient awake    Reviewed: Allergy & Precautions, NPO status , Patient's Chart, lab work & pertinent test results  Airway Mallampati: II  TM Distance: >3 FB Neck ROM: Full    Dental  (+) Teeth Intact, Dental Advisory Given   Pulmonary asthma ,    Pulmonary exam normal       Cardiovascular + Peripheral Vascular Disease Normal cardiovascular exam Brain Aneurysm: actually, it is a small carotid aneurysm which is stable by MRI, no treatment, followed only with yearly MRI at [place] Echo 2015 Study Conclusions - Stress ECG conclusions: There were no stress arrhythmiasor conduction abnormalities. The stress ECG was equivocalfor ischemia. [facility] scoring: exercise time of 1min;maximum ST deviation of 75mm; no angina; resulting score is6. This score predicts a low risk of cardiac events.- Staged echo: Normal echo stress Impressions:  - Normal study after maximal exercise.  Echocardiography (2013).   EF was 55%.    Neuro/Psych negative neurological ROS  negative psych ROS   GI/Hepatic Neg liver ROS,   Endo/Other  negative endocrine ROS  Renal/GU negative Renal ROS  negative genitourinary   Musculoskeletal Sjogren"s Syndrome   Abdominal   Peds negative pediatric ROS (+)  Hematology negative hematology ROS (+)   Anesthesia Other Findings   Reproductive/Obstetrics negative OB ROS                           Anesthesia Physical Anesthesia Plan  ASA: II  Anesthesia Plan: General   Post-op Pain Management:    Induction: Intravenous  Airway Management Planned: Oral ETT  Additional Equipment:   Intra-op Plan:   Post-operative Plan: Extubation in OR  Informed Consent: I have reviewed the patients History and Physical, chart, labs and discussed the procedure including the risks, benefits and alternatives for  the proposed anesthesia with the patient or authorized representative who has indicated his/her understanding and acceptance.   Dental advisory given  Plan Discussed with:   Anesthesia Plan Comments:         Anesthesia Quick Evaluation

## 2015-04-10 NOTE — H&P (Signed)
Stefanie [name] is an 49 y.o. female.   Chief Complaint:   Here for elective surgery HPI:  She has a long history of symptomatic biliary dyskinesia and now presents for elective laparoscopic cholecystectomy.  She has been taking oral antibiotics for a recent toe infection which is improving.  Past Medical History  Diagnosis Date  . Melanoma 2013    RLE, follows with derm for same  . IBS (irritable bowel syndrome)   . Heart palpitations     holter 10/2013 with few PVCs/PACs, normal stress echo 12/2013  . Allergy   . Shingles     recurrent HSV1  . Sjogren's syndrome 07/23/2014    Dx summer 2015 -follows with rheum for same  . Brain aneurysm 09/2012 dx    follows at [facility] for same  . Asthma     "ASTHMATIC AIRWAY DISEASE", NO INHALER    Past Surgical History  Procedure Laterality Date  . Speenoidectomy    . Lasik    . Abdominal hysterectomy    . Right leg fx    . Wisdom tooth extraction    . Breast biopsy Left   . Colonoscopy      Family History  Problem Relation Age of Onset  . Rheum arthritis Mother   . Diabetes Father   . Hypertension Father   . Heart disease Father 68    MI with CABG x 5 age 34  . Esophageal cancer Paternal Aunt   . Rectal cancer Neg Hx   . Stomach cancer Neg Hx   . Colon cancer Cousin   . Pancreatic cancer Cousin   . Heart attack Brother 43   Social History:  reports that she has never smoked. She has never used smokeless tobacco. She reports that she does not drink alcohol or use illicit drugs.  Allergies:  Allergies  Allergen Reactions  . Morphine And Related Hives and Itching  . Coppertone Spf4 Itching  . Sulfa Antibiotics Hives and Itching    headaches  . Latex Rash    Medications Prior to Admission  Medication Sig Dispense Refill  . doxycycline (VIBRAMYCIN) 100 MG capsule Take 1 capsule (100 mg total) by mouth 2 (two) times daily. 20 capsule 0  . omega-3 acid ethyl esters (LOVAZA) 1 G capsule Take 1 g by mouth daily.    Vladimir Faster Glycol-Propyl Glycol 0.4-0.3 % SOLN Place 3 drops into both eyes 3 (three) times daily as needed (dry eyes). Systane    . valACYclovir (VALTREX) 1000 MG tablet Take 1 tablet (1,000 mg total) by mouth 3 (three) times daily. (Patient taking differently: Take 1,000 mg by mouth 3 (three) times daily as needed (break out). ) 21 tablet 1    No results found for this or any previous visit (from the past 48 hour(s)). No results found.  Review of Systems  Constitutional: Negative for fever and chills.    Blood pressure 116/68, pulse 56, temperature 97.6 F (36.4 C), temperature source Oral, resp. rate 18, height 5\' 2"  (1.575 m), weight 50.349 kg (111 lb), SpO2 100 %. Physical Exam  Constitutional:  Thin female in NAD.  Eyes: No scleral icterus.  Cardiovascular: Normal rate and regular rhythm.   Respiratory: Effort normal and breath sounds normal.  GI: Soft. She exhibits no mass. There is no tenderness.  Musculoskeletal:  Small open wound with slight redness on 2 toe.  Neurological: She is alert.  Skin: Skin is warm and dry.  Psychiatric: She has a normal mood  and affect. Her behavior is normal.     Assessment/Plan Symptomatic biliary dyskinesia  Plan:  Laparoscopic cholecystectomy.  [name] J 04/10/2015, 10:59 AM

## 2015-04-10 NOTE — Op Note (Signed)
OPERATIVE NOTE-  Preoperative diagnosis:  Biliary dyskinesia  Postoperative diagnosis:  same  Procedure: Laparoscopic cholecystectomy with cholangiogram.  Surgeon: Jackolyn Confer, M.D.  Asst.:  none  Anesthesia: General  Indication:   This is a 49 year old female who's had symptomatic biliary dyskinesia for some time. She now presents for elective laparoscopic cholecystectomy. We have discussed the procedure, risks, and aftercare.  Technique: She was brought to the operating room, placed supine on the operating table, and a general anesthetic was administered.  The abdominal wall was then sterilely prepped and draped. Local anesthetic (Marcaine) was infiltrated in the subumbilical region. A small subumbilical incision was made through the skin, subcutaneous tissue, fascia, and peritoneum entering the peritoneal cavity under direct vision. A pursestring suture of 0 Vicryl was placed around the edges of the fascia. A Hassan trocar was introduced into the peritoneal cavity and a pneumoperitoneum was created by insufflation of carbon dioxide gas. The laparoscope was introduced into the trocar and no underlying bleeding or organ injury was noted. He/She was then placed in the reverse Trendelenburg position with the right side tilted slightly up.  Three 5 mm trocars were then placed into the abdominal cavity under laparoscopic vision. One in the epigastric area, and 2 in the right upper quadrant area. The gallbladder was visualized and the fundus was grasped and retracted toward the right shoulder.  The infundibulum was mobilized with dissection close to the gallbladder and retracted laterally. The cystic duct was identified and a window was created around it. The anterior branch of the cystic artery was also identified and a window was created around it. It was clipped and divided The critical view was achieved. A clip was placed at the neck of the gallbladder. A small incision was made in the cystic  duct. A cholangiocatheter was introduced through the anterior abdominal wall and placed in the cystic duct. A intraoperative cholangiogram was then performed.  Under real-time fluoroscopy, dilute contrast was injected into the cystic duct.  The common hepatic duct, the right and left hepatic ducts, and the common duct were all visualized. Contrast drained into the duodenum without obvious evidence of any obstructing ductal lesion. The final report is pending the Radiologist's interpretation.  The cholangiocatheter was removed, the cystic duct was clipped 3 times on the biliary side, and then the cystic duct was divided sharply. No bile leak was noted from the cystic duct stump.  The posterior branch of the cystic artery was then isolated clipped and divided. Following this the gallbladder was dissected free from the liver using electrocautery. The gallbladder was then placed in a retrieval bag and removed from the abdominal cavity through the subumbilical incision.  The gallbladder fossa was inspected, irrigated, and bleeding was controlled with electrocautery. Inspection showed that hemostasis was adequate and there was no evidence of bile leak.  The irrigation fluid was evacuated as much as possible.  The subumbilical trocar was removed and the fascial defect was closed by tightening and tying down the pursestring suture under laparoscopic vision.  The remaining trocars were removed and the pneumoperitoneum was released. The skin incisions were closed with 4-0 Monocryl subcuticular stitches. Steri-Strips and sterile dressings were applied.  The procedure was well-tolerated without any apparent complications. She was taken to the recovery room in satisfactory condition.

## 2015-04-10 NOTE — Anesthesia Postprocedure Evaluation (Signed)
  Anesthesia Post-op Note  Patient: Stefanie [name]  Procedure(s) Performed: Procedure(s): LAPAROSCOPIC CHOLECYSTECTOMY WITH INTRAOPERATIVE CHOLANGIOGRAM (N/A)  Patient Location: PACU  Anesthesia Type: General   Level of Consciousness: awake, alert  and oriented  Airway and Oxygen Therapy: Patient Spontanous Breathing  Post-op Pain: moderate  Post-op Assessment: Post-op Vital signs reviewed  Post-op Vital Signs: Reviewed  Last Vitals:  Filed Vitals:   04/10/15 1400  BP: 107/56  Pulse: 58  Temp:   Resp: 17    Complications: No apparent anesthesia complications

## 2015-04-10 NOTE — Progress Notes (Signed)
Phenergan & dilaudid given in PACU.  Pt sleeping soundly. When awake, pt states "little relief" of pain & nausea and she does not feel comfortable going home today. Dr. Zella Richer notified. Orders to admit for overnight observation. Family updated via Museum/gallery exhibitions officer.

## 2015-04-10 NOTE — Discharge Instructions (Signed)
[facility] ______[facility] LAPAROSCOPIC SURGERY: POST OP INSTRUCTIONS Always review your discharge instruction sheet given to you by the facility where your surgery was performed. IF YOU HAVE DISABILITY OR FAMILY LEAVE FORMS, YOU MUST BRING THEM TO THE OFFICE FOR PROCESSING.   DO NOT GIVE THEM TO YOUR DOCTOR.  1. A prescription for pain medication may be given to you upon discharge.  Take your pain medication as prescribed, if needed.  If narcotic pain medicine is not needed, then you may take acetaminophen (Tylenol) or ibuprofen (Advil) as needed. 2. Take your usually prescribed medications unless otherwise directed. 3. If you need a refill on your pain medication, please contact your pharmacy.  They will contact our office to request authorization. Prescriptions will not be filled after 5pm or on week-ends. 4. You should follow a light diet the first few days after arrival home, such as soup and crackers, etc.  Be sure to include lots of fluids daily. 5. Most patients will experience some swelling and bruising in the area of the incisions.  Ice packs will help.  Swelling and bruising can take several days to resolve.  6. It is common to experience some constipation if taking pain medication after surgery.  Increasing fluid intake and taking a stool softener (such as Colace) will usually help or prevent this problem from occurring.  A mild laxative (Milk of Magnesia or Miralax) should be taken according to package instructions if there are no bowel movements after 48 hours. 7. Unless discharge instructions indicate otherwise, you may remove your bandages 72 hours after surgery, and you may shower at that time.  You may have steri-strips (small skin tapes) in place directly over the incision.  These strips should be left on the skin for 14 days.  If your surgeon used skin glue on the incision, you may shower in 24 hours.  The glue will flake off over the next 2-3 weeks.  Any sutures or  staples will be removed at the office during your follow-up visit. 8. ACTIVITIES:  You may resume regular (light) daily activities beginning the next day--such as daily self-care, walking, climbing stairs--gradually increasing activities as tolerated.  You may have sexual intercourse when it is comfortable.  Refrain from any heavy lifting or straining-nothing over 10 pounds for 2 weeks.  a. You may drive when you are no longer taking prescription pain medication, you can comfortably wear a seatbelt, and you can safely maneuver your car and apply brakes. b. RETURN TO WORK:  Desk work in one week, full duty in 2 weeks if comfortable.__________________________________________________________ 9. You should see your doctor in the office for a follow-up appointment approximately 3 weeks after your surgery.  Make sure that you call for this appointment within a day or two after you arrive home to insure a convenient appointment time. 10. OTHER INSTRUCTIONS: __________________________________________________________________________________________________________________________ __________________________________________________________________________________________________________________________ WHEN TO CALL YOUR DOCTOR: 1. Fever over 101.0 2. Inability to urinate 3. Continued bleeding from incision. 4. Increased pain, redness, or drainage from the incision. 5. Increasing abdominal pain  The clinic staff is available to answer your questions during regular business hours.  Please dont hesitate to call and ask to speak to one of the nurses for clinical concerns.  If you have a medical emergency, go to the nearest emergency room or call 911.  A surgeon from [facility] is always on call at the hospital. [address], [place], [place], [place]  [address] ? [address] [address], [place], [place]  [address] [phone] ? [phone] ? FAX [phone] Web site:  [email or web address]

## 2015-04-11 DIAGNOSIS — K819 Cholecystitis, unspecified: Secondary | ICD-10-CM | POA: Diagnosis not present

## 2015-04-11 NOTE — Discharge Summary (Signed)
Physician Discharge Summary  Stefanie [name] Sur SWF:[id number] DOB: [birth year] DOA: 04/10/2015  PCP: Gwendolyn Grant, MD  Admit date: 04/10/2015 Discharge date: 04/11/2015  Recommendations for Outpatient Follow-up:   Follow-up Information    Follow up with ROSENBOWER,TODD J, MD. Schedule an appointment as soon as possible for a visit in 3 weeks.   Specialty:  General Surgery   Why:  For wound re-check   Contact information:   [place] [address] [phone]      Discharge Diagnoses:  1. Biliary dyskinesia  2. IBS  Surgical Procedure: Laparoscopic cholecystectomy w/IOC 6/10  Discharge Condition: good Disposition: home  Diet recommendation: Low fat  Filed Weights   04/10/15 1015  Weight: 50.349 kg (111 lb)    History of present illness: Pt presented for planned cholecystectomy due to biliary dyskinesia   Hospital Course:  She was taken to the OR by Dr Zella Richer for Lap chole with [place] 6/10. She was kept overnight for observation. Since surgery, she has ambulated in the halls, tolerated clears, and her pain is controlled. She has not had any vomiting. She appears stable for discharge on POD 1. We discussed discharge instructions.   BP 91/45 mmHg  Pulse 59  Temp(Src) 98.4 F (36.9 C) (Oral)  Resp 16  Ht 5\' 2"  (1.575 m)  Wt 50.349 kg (111 lb)  BMI 20.30 kg/m2  SpO2 100%  LMP   Gen: asleep, easily awakens, NAD, non-toxic appearing Pupils: equal, no scleral icterus Pulm: Lungs clear to auscultation, symmetric chest rise CV: regular rate and rhythm Abd: soft, mild approp right abd tenderness, nondistended. Dressing c/d/i. No cellulitis.  Ext: no edema, no calf tenderness Skin: no rash, no jaundice, not pale    Discharge Instructions  Discharge Instructions    Call MD for:  difficulty breathing, headache or visual disturbances    Complete by:  As directed      Call MD for:  persistant nausea and vomiting    Complete by:  As directed       Call MD for:  redness, tenderness, or signs of infection (pain, swelling, redness, odor or green/yellow discharge around incision site)    Complete by:  As directed      Call MD for:  severe uncontrolled pain    Complete by:  As directed      Call MD for:    Complete by:  As directed   Temp >101     Diet general    Complete by:  As directed   Avoid fatty, greasy foods     Discharge instructions    Complete by:  As directed   See [facility] discharge instructions     Increase activity slowly    Complete by:  As directed             Medication List    TAKE these medications        doxycycline 100 MG capsule  Commonly known as:  VIBRAMYCIN  Take 1 capsule (100 mg total) by mouth 2 (two) times daily.     omega-3 acid ethyl esters 1 G capsule  Commonly known as:  LOVAZA  Take 1 g by mouth daily.     ondansetron 4 MG tablet  Commonly known as:  ZOFRAN  Take 1 tablet (4 mg total) by mouth every 4 (four) hours as needed for nausea.     oxyCODONE 5 MG immediate release tablet  Commonly known as:  Oxy IR/ROXICODONE  Take 1-2  tablets (5-10 mg total) by mouth every 4 (four) hours as needed for moderate pain, severe pain or breakthrough pain.     Polyethyl Glycol-Propyl Glycol 0.4-0.3 % Soln  Place 3 drops into both eyes 3 (three) times daily as needed (dry eyes). Systane     valACYclovir 1000 MG tablet  Commonly known as:  VALTREX  Take 1 tablet (1,000 mg total) by mouth 3 (three) times daily.           Follow-up Information    Follow up with ROSENBOWER,TODD J, MD. Schedule an appointment as soon as possible for a visit in 3 weeks.   Specialty:  General Surgery   Why:  For wound re-check   Contact information:   [place] [address] [phone]        The results of significant diagnostics from this hospitalization (including imaging, microbiology, ancillary and laboratory) are listed below for reference.    Significant Diagnostic  Studies: Dg Cholangiogram Operative  04/10/2015   CLINICAL DATA:  Biliary dyskinesia  EXAM: INTRAOPERATIVE CHOLANGIOGRAM  TECHNIQUE: Cholangiographic images from the C-arm fluoroscopic device were submitted for interpretation post-operatively. Please see the procedural report for the amount of contrast and the fluoroscopy time utilized.  COMPARISON:  Scintigraphy 05/19/2014  FINDINGS: No persistent filling defects in the common duct. Intrahepatic ducts are incompletely visualized, appearing decompressed centrally. Contrast passes into the duodenum.  : Negative for retained common duct stone.   Electronically Signed   By: Lucrezia Europe M.D.   On: 04/10/2015 15:27    Microbiology: Recent Results (from the past 240 hour(s))  Wound culture     Status: None   Collection Time: 04/05/15  5:31 PM  Result Value Ref Range Status   Culture Moderate STAPHYLOCOCCUS AUREUS  Final   Gram Stain No WBC Seen  Final   Gram Stain No Squamous Epithelial Cells Seen  Final   Gram Stain Rare Gram Positive Cocci In Clusters  Final   Organism ID, Bacteria STAPHYLOCOCCUS AUREUS  Final    Comment: Rifampin and Gentamicin should not be used as single drugs for treatment of Staph infections.       Susceptibility   Staphylococcus aureus -  (no method available)    PENICILLIN  Resistant     OXACILLIN <=0.25 Sensitive     CEFAZOLIN  Sensitive     GENTAMICIN <=0.5 Sensitive     CIPROFLOXACIN <=0.5 Sensitive     LEVOFLOXACIN 0.25 Sensitive     MOXIFLOXACIN <=0.25 Sensitive     TRIMETH/SULFA <=10 Sensitive     VANCOMYCIN 1 Sensitive     CLINDAMYCIN <=0.25 Sensitive     ERYTHROMYCIN <=0.25 Sensitive     LINEZOLID 2 Sensitive     QUINUPRISTIN/DALFOPRISTIN <=0.25 Sensitive     RIFAMPIN <=0.5 Sensitive     TETRACYCLINE <=1 Sensitive       Active Problems:   Biliary dyskinesia   Time coordinating discharge: 10 min  Signed:  Gayland Curry, MD [facility], [place] [phone] 04/11/2015, 8:29 AM

## 2015-04-11 NOTE — Progress Notes (Signed)
Discharge home. Home discharge instruction given, no question verbalized.tolerated lunch, denies nausea .

## 2015-04-13 ENCOUNTER — Encounter (HOSPITAL_COMMUNITY): Payer: Self-pay | Admitting: General Surgery

## 2015-05-27 ENCOUNTER — Encounter: Payer: Self-pay | Admitting: Internal Medicine

## 2015-05-27 ENCOUNTER — Ambulatory Visit (INDEPENDENT_AMBULATORY_CARE_PROVIDER_SITE_OTHER): Payer: 59 | Admitting: Internal Medicine

## 2015-05-27 VITALS — BP 106/62 | HR 60 | Temp 97.7°F | Ht 62.0 in | Wt 108.5 lb

## 2015-05-27 DIAGNOSIS — Z23 Encounter for immunization: Secondary | ICD-10-CM

## 2015-05-27 DIAGNOSIS — E559 Vitamin D deficiency, unspecified: Secondary | ICD-10-CM

## 2015-05-27 DIAGNOSIS — Z0000 Encounter for general adult medical examination without abnormal findings: Secondary | ICD-10-CM

## 2015-05-27 DIAGNOSIS — I671 Cerebral aneurysm, nonruptured: Secondary | ICD-10-CM | POA: Diagnosis not present

## 2015-05-27 DIAGNOSIS — M3500 Sicca syndrome, unspecified: Secondary | ICD-10-CM

## 2015-05-27 MED ORDER — VALACYCLOVIR HCL 1 G PO TABS: 1000.0000 mg | ORAL_TABLET | Freq: Three times a day (TID) | ORAL | Status: DC

## 2015-05-27 NOTE — Assessment & Plan Note (Signed)
MRI 07/2014 stable - due for annual follow up soon To follow up Nsurg @ [place] as planned Support offered

## 2015-05-27 NOTE — Progress Notes (Signed)
Subjective:    Patient ID: Stefanie [name], female    DOB: [birth year], 49 y.o.   MRN: [id number]  HPI  patient is here today for annual physical. Patient feels well and has no complaints.  Past Medical History  Diagnosis Date  . Melanoma 2013    RLE, follows with derm for same  . IBS (irritable bowel syndrome)   . Heart palpitations     holter 10/2013 with few PVCs/PACs, normal stress echo 12/2013  . 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 Sjogren's syndrome 07/23/2014    Dx summer 2015 -follows with rheum for same  . Brain aneurysm 09/2012 dx    follows at [facility] for same   Family History  Problem Relation Age of Onset  . Rheum arthritis Mother   . Diabetes Father   . Hypertension Father   . Heart disease Father 19    MI with CABG x 5 age 42  . Esophageal cancer Paternal Aunt   . Rectal cancer Neg Hx   . Stomach cancer Neg Hx   . Colon cancer Cousin   . Pancreatic cancer Cousin   . Heart attack Brother 50   History  Substance Use Topics  . Smoking status: Never Smoker   . Smokeless tobacco: Never Used  . Alcohol Use: No    Review of Systems  Constitutional: Negative for fatigue and unexpected weight change.  Respiratory: Negative for cough, shortness of breath and wheezing.   Cardiovascular: Negative for chest pain, palpitations and leg swelling.  Gastrointestinal: Negative for nausea, abdominal pain and diarrhea.  Musculoskeletal: Positive for arthralgias.  Neurological: Negative for dizziness, weakness, light-headedness and headaches.  Psychiatric/Behavioral: Negative for dysphoric mood. The patient is not nervous/anxious.   All other systems reviewed and are negative.      Objective:    Physical Exam  Constitutional: She appears well-developed and well-nourished. No distress.  Cardiovascular: Normal rate, regular rhythm and normal heart sounds.   No murmur heard. Pulmonary/Chest: Effort normal and breath sounds normal. No respiratory distress.    Musculoskeletal: She exhibits no edema.    BP 106/62 mmHg  Pulse 60  Temp(Src) 97.7 F (36.5 C) (Oral)  Ht 5\' 2"  (1.575 m)  Wt 108 lb 8 oz (49.215 kg)  BMI 19.84 kg/m2  SpO2 99% Wt Readings from Last 3 Encounters:  05/27/15 108 lb 8 oz (49.215 kg)  04/10/15 111 lb (50.349 kg)  04/05/15 111 lb (50.349 kg)     Lab Results  Component Value Date   WBC 6.5 04/02/2015   HGB 14.0 04/02/2015   HCT 39.5 04/02/2015   PLT 234 04/02/2015   GLUCOSE 120* 04/02/2015   CHOL 185 04/22/2012   TRIG 76 04/22/2012   HDL 61 04/22/2012   LDLCALC 109* 04/22/2012   ALT 18 04/02/2015   AST 23 04/02/2015   NA 141 04/02/2015   K 3.7 04/02/2015   CL 106 04/02/2015   CREATININE 0.75 04/02/2015   BUN 8 04/02/2015   CO2 27 04/02/2015   TSH 3.77 04/29/2014   INR 1.05 04/02/2015   HGBA1C 5.4 04/22/2012    No results found.     Assessment & Plan:   CPX/z00.00 - Patient has been counseled on age-appropriate routine health concerns for screening and prevention. These are reviewed and up-to-date. Immunizations are up-to-date or declined. Labs ordered and reviewed.  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    MRI  07/2014 stable - due for annual follow up soon To follow up Nsurg @ [facility] as planned Support offered       Sjogren's syndrome    Dx clarified summer 2015 Following with rheum for same and considering Plaquenil or other med tx if symptoms worsen Interval reviewed, support provided       Other Visit Diagnoses    Routine general medical examination at a health care facility    -  Primary    Relevant Orders    Basic metabolic panel    CBC with Differential/Platelet    Hepatic function panel    Lipid panel    TSH    Urinalysis, Routine w reflex microscopic (not at [facility])        Gwendolyn Grant, MD

## 2015-05-27 NOTE — Patient Instructions (Addendum)
It was good to see you today.  We have reviewed your prior records including labs and tests today  Health Maintenance reviewed - T Updated today, good for 10 years. All other recommended immunizations and age-appropriate screenings are up-to-date.  Test(s) ordered today. Return when you are fasting and after any other labs are entered per rheumatology request. Your results will be released to Stefanie [name] (or called to you) after review, usually within 72hours after test completion. If any changes need to be made, you will be notified at that same time.  Medications reviewed and updated, no changes recommended at this time.  Please schedule followup in 12 months for annual exam and labs, call sooner if problems.  Health Maintenance Adopting a healthy lifestyle and getting preventive care can go a long way to promote health and wellness. Talk with your health care provider about what schedule of regular examinations is right for you. This is a good chance for you to check in with your provider about disease prevention and staying healthy. In between checkups, there are plenty of things you can do on your own. Experts have done a lot of research about which lifestyle changes and preventive measures are most likely to keep you healthy. Ask your health care provider for more information. WEIGHT AND DIET  Eat a healthy diet  Be sure to include plenty of vegetables, fruits, low-fat dairy products, and lean protein.  Do not eat a lot of foods high in solid fats, added sugars, or salt.  Get regular exercise. This is one of the most important things you can do for your health.  Most adults should exercise for at least 150 minutes each week. The exercise should increase your heart rate and make you sweat (moderate-intensity exercise).  Most adults should also do strengthening exercises at least twice a week. This is in addition to the moderate-intensity exercise.  Maintain a healthy weight  Body mass  index (BMI) is a measurement that can be used to identify possible weight problems. It estimates body fat based on height and weight. Your health care provider can help determine your BMI and help you achieve or maintain a healthy weight.  For females 61 years of age and older:   A BMI below 18.5 is considered underweight.  A BMI of 18.5 to 24.9 is normal.  A BMI of 25 to 29.9 is considered overweight.  A BMI of 30 and above is considered obese.  Watch levels of cholesterol and blood lipids  You should start having your blood tested for lipids and cholesterol at 49 years of age, then have this test every 5 years.  You may need to have your cholesterol levels checked more often if:  Your lipid or cholesterol levels are high.  You are older than 49 years of age.  You are at high risk for heart disease.  CANCER SCREENING   Lung Cancer  Lung cancer screening is recommended for adults 25-74 years old who are at high risk for lung cancer because of a history of smoking.  A yearly low-dose CT scan of the lungs is recommended for people who:  Currently smoke.  Have quit within the past 15 years.  Have at least a 30-pack-year history of smoking. A pack year is smoking an average of one pack of cigarettes a day for 1 year.  Yearly screening should continue until it has been 15 years since you quit.  Yearly screening should stop if you develop a health problem that would  prevent you from having lung cancer treatment.  Breast Cancer  Practice breast self-awareness. This means understanding how your breasts normally appear and feel.  It also means doing regular breast self-exams. Let your health care provider know about any changes, no matter how small.  If you are in your 20s or 30s, you should have a clinical breast exam (CBE) by a health care provider every 1-3 years as part of a regular health exam.  If you are 44 or older, have a CBE every year. Also consider having a  breast X-ray (mammogram) every year.  If you have a family history of breast cancer, talk to your health care provider about genetic screening.  If you are at high risk for breast cancer, talk to your health care provider about having an MRI and a mammogram every year.  Breast cancer gene (BRCA) assessment is recommended for women who have family members with BRCA-related cancers. BRCA-related cancers include:  Breast.  Ovarian.  Tubal.  Peritoneal cancers.  Results of the assessment will determine the need for genetic counseling and BRCA1 and BRCA2 testing. Cervical Cancer Routine pelvic examinations to screen for cervical cancer are no longer recommended for nonpregnant women who are considered low risk for cancer of the pelvic organs (ovaries, uterus, and vagina) and who do not have symptoms. A pelvic examination may be necessary if you have symptoms including those associated with pelvic infections. Ask your health care provider if a screening pelvic exam is right for you.   The Pap test is the screening test for cervical cancer for women who are considered at risk.  If you had a hysterectomy for a problem that was not cancer or a condition that could lead to cancer, then you no longer need Pap tests.  If you are older than 65 years, and you have had normal Pap tests for the past 10 years, you no longer need to have Pap tests.  If you have had past treatment for cervical cancer or a condition that could lead to cancer, you need Pap tests and screening for cancer for at least 20 years after your treatment.  If you no longer get a Pap test, assess your risk factors if they change (such as having a new sexual partner). This can affect whether you should start being screened again.  Some women have medical problems that increase their chance of getting cervical cancer. If this is the case for you, your health care provider may recommend more frequent screening and Pap tests.  The  human papillomavirus (HPV) test is another test that may be used for cervical cancer screening. The HPV test looks for the virus that can cause cell changes in the cervix. The cells collected during the Pap test can be tested for HPV.  The HPV test can be used to screen women 82 years of age and older. Getting tested for HPV can extend the interval between normal Pap tests from three to five years.  An HPV test also should be used to screen women of any age who have unclear Pap test results.  After 49 years of age, women should have HPV testing as often as Pap tests.  Colorectal Cancer  This type of cancer can be detected and often prevented.  Routine colorectal cancer screening usually begins at 49 years of age and continues through 49 years of age.  Your health care provider may recommend screening at an earlier age if you have risk factors for colon cancer.  Your health care provider may also recommend using home test kits to check for hidden blood in the stool.  A small camera at the end of a tube can be used to examine your colon directly (sigmoidoscopy or colonoscopy). This is done to check for the earliest forms of colorectal cancer.  Routine screening usually begins at age 76.  Direct examination of the colon should be repeated every 5-10 years through 49 years of age. However, you may need to be screened more often if early forms of precancerous polyps or small growths are found. Skin Cancer  Check your skin from head to toe regularly.  Tell your health care provider about any new moles or changes in moles, especially if there is a change in a mole's shape or color.  Also tell your health care provider if you have a mole that is larger than the size of a pencil eraser.  Always use sunscreen. Apply sunscreen liberally and repeatedly throughout the day.  Protect yourself by wearing long sleeves, pants, a wide-brimmed hat, and sunglasses whenever you are outside. HEART  DISEASE, DIABETES, AND HIGH BLOOD PRESSURE   Have your blood pressure checked at least every 1-2 years. High blood pressure causes heart disease and increases the risk of stroke.  If you are between 35 years and 23 years old, ask your health care provider if you should take aspirin to prevent strokes.  Have regular diabetes screenings. This involves taking a blood sample to check your fasting blood sugar level.  If you are at a normal weight and have a low risk for diabetes, have this test once every three years after 49 years of age.  If you are overweight and have a high risk for diabetes, consider being tested at a younger age or more often. PREVENTING INFECTION  Hepatitis B  If you have a higher risk for hepatitis B, you should be screened for this virus. You are considered at high risk for hepatitis B if:  You were born in a country where hepatitis B is common. Ask your health care provider which countries are considered high risk.  Your parents were born in a high-risk country, and you have not been immunized against hepatitis B (hepatitis B vaccine).  You have HIV or AIDS.  You use needles to inject street drugs.  You live with someone who has hepatitis B.  You have had sex with someone who has hepatitis B.  You get hemodialysis treatment.  You take certain medicines for conditions, including cancer, organ transplantation, and autoimmune conditions. Hepatitis C  Blood testing is recommended for:  Everyone born from [birth year] through 1965.  Anyone with known risk factors for hepatitis C. Sexually transmitted infections (STIs)  You should be screened for sexually transmitted infections (STIs) including gonorrhea and chlamydia if:  You are sexually active and are younger than 49 years of age.  You are older than 49 years of age and your health care provider tells you that you are at risk for this type of infection.  Your sexual activity has changed since you were last  screened and you are at an increased risk for chlamydia or gonorrhea. Ask your health care provider if you are at risk.  If you do not have HIV, but are at risk, it may be recommended that you take a prescription medicine daily to prevent HIV infection. This is called pre-exposure prophylaxis (PrEP). You are considered at risk if:  You are sexually active and do not regularly use  condoms or know the HIV status of your partner(s).  You take drugs by injection.  You are sexually active with a partner who has HIV. Talk with your health care provider about whether you are at high risk of being infected with HIV. If you choose to begin PrEP, you should first be tested for HIV. You should then be tested every 3 months for as long as you are taking PrEP.  PREGNANCY   If you are premenopausal and you may become pregnant, ask your health care provider about preconception counseling.  If you may become pregnant, take 400 to 800 micrograms (mcg) of folic acid every day.  If you want to prevent pregnancy, talk to your health care provider about birth control (contraception). OSTEOPOROSIS AND MENOPAUSE   Osteoporosis is a disease in which the bones lose minerals and strength with aging. This can result in serious bone fractures. Your risk for osteoporosis can be identified using a bone density scan.  If you are 81 years of age or older, or if you are at risk for osteoporosis and fractures, ask your health care provider if you should be screened.  Ask your health care provider whether you should take a calcium or vitamin D supplement to lower your risk for osteoporosis.  Menopause may have certain physical symptoms and risks.  Hormone replacement therapy may reduce some of these symptoms and risks. Talk to your health care provider about whether hormone replacement therapy is right for you.  HOME CARE INSTRUCTIONS   Schedule regular health, dental, and eye exams.  Stay current with your  immunizations.   Do not use any tobacco products including cigarettes, chewing tobacco, or electronic cigarettes.  If you are pregnant, do not drink alcohol.  If you are breastfeeding, limit how much and how often you drink alcohol.  Limit alcohol intake to no more than 1 drink per day for nonpregnant women. One drink equals 12 ounces of beer, 5 ounces of wine, or 1 ounces of hard liquor.  Do not use street drugs.  Do not share needles.  Ask your health care provider for help if you need support or information about quitting drugs.  Tell your health care provider if you often feel depressed.  Tell your health care provider if you have ever been abused or do not feel safe at home. Document Released: 05/02/2011 Document Revised: 03/03/2014 Document Reviewed: 09/18/2013 [facility] Patient Information 2015 [place], [place]. This information is not intended to replace advice given to you by your health care provider. Make sure you discuss any questions you have with your health care provider.

## 2015-05-27 NOTE — Assessment & Plan Note (Signed)
Dx clarified summer 2015 Following with rheum for same and considering Plaquenil or other med tx if symptoms worsen Interval reviewed, support provided

## 2015-05-27 NOTE — Progress Notes (Signed)
Pre visit review using our clinic review tool, if applicable. No additional management support is needed unless otherwise documented below in the visit note. 

## 2015-07-03 ENCOUNTER — Encounter: Payer: Self-pay | Admitting: Emergency Medicine

## 2015-07-03 ENCOUNTER — Emergency Department
Admission: EM | Admit: 2015-07-03 | Discharge: 2015-07-03 | Disposition: A | Discharge status: Home / Self Care | Payer: 59 | Source: Home / Self Care | Attending: Family Medicine | Admitting: Family Medicine

## 2015-07-03 DIAGNOSIS — T1592XA Foreign body on external eye, part unspecified, left eye, initial encounter: Secondary | ICD-10-CM

## 2015-07-03 MED ORDER — KETOROLAC TROMETHAMINE 0.5 % OP SOLN: 1.0000 [drp] | Freq: Four times a day (QID) | OPHTHALMIC | Status: DC

## 2015-07-03 NOTE — ED Notes (Signed)
Left eye, bug flew into last night, red, hurts, vision blurred

## 2015-07-03 NOTE — Discharge Instructions (Signed)
Apply cool compress to left eye every 3 to 4 hours until improved.  May continue lubricating eye drops as needed.

## 2015-07-03 NOTE — ED Provider Notes (Signed)
CSN: [id number]     Arrival date & time 07/03/15  1836 History   First MD Initiated Contact with Patient 07/03/15 1856     Chief Complaint  Patient presents with  . Eye Pain      HPI Comments: While walking her dog last night an insect flew into her left eye.  She was able to remove the insect and several parts after lavaging with eye solution.  She has had persistent pain in her left eye and swelling of upper eyelid, and decreased vision.  Patient is a 49 y.o. female presenting with foreign body in eye. The history is provided by the patient.  Foreign Body in Eye This is a new problem. The current episode started yesterday. The problem occurs constantly. The problem has not changed since onset.Associated symptoms comments: Blurred vision. Exacerbated by: blinking. Nothing relieves the symptoms. Treatments tried: eye lavage and lubricating eye drops. The treatment provided mild relief.    Past Medical History  Diagnosis Date  . Melanoma 2013    RLE, follows with derm for same  . IBS (irritable bowel syndrome)   . Heart palpitations     holter 10/2013 with few PVCs/PACs, normal stress echo 12/2013  . 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 Sjogren's syndrome 07/23/2014    Dx summer 2015 -follows with rheum for same  . Brain aneurysm 09/2012 dx    follow[facility] at [facility]   Past Surgical History  Procedure Laterality Date  . Speenoidectomy    . Lasik    . Abdominal hysterectomy    . Right leg fx    . Wisdom tooth extraction    . Breast biopsy Left   . Colonoscopy    . Cholecystectomy N/A 04/10/2015    Procedure: LAPAROSCOPIC CHOLECYSTECTOMY WITH INTRAOPERATIVE CHOLANGIOGRAM;  Surgeon: Jackolyn Confer, MD;  Location: [facility] OR;  Service: General;  Laterality: N/A;   Family History  Problem Relation Age of Onset  . Rheum arthritis Mother   . Diabetes Father   . Hypertension Father   . Heart disease Father 57    MI with CABG x 5 age 60  . Esophageal cancer Paternal Aunt   . Rectal  cancer Neg Hx   . Stomach cancer Neg Hx   . Colon cancer Cousin   . Pancreatic cancer Cousin   . Heart attack Brother 40   Social History  Substance Use Topics  . Smoking status: Never Smoker   . Smokeless tobacco: Never Used  . Alcohol Use: No   OB History    No data available     Review of Systems  Constitutional: Negative.   HENT: Negative.   Eyes: Positive for pain and redness. Negative for photophobia, discharge, itching and visual disturbance.  All other systems reviewed and are negative.   Allergies  Morphine and related; Coppertone spf4; Sulfa antibiotics; and Latex  Home Medications   Prior to Admission medications   Medication Sig Start Date End Date Taking? Authorizing Provider  ketorolac (ACULAR) 0.5 % ophthalmic solution Place 1 drop into the left eye 4 (four) times daily. 07/03/15   Kandra Nicolas, MD  omega-3 acid ethyl esters (LOVAZA) 1 G capsule Take 1 g by mouth daily.    Historical Provider, MD  Polyethyl Glycol-Propyl Glycol 0.4-0.3 % SOLN Place 3 drops into both eyes 3 (three) times daily as needed (dry eyes). Systane    Historical Provider, MD  valACYclovir (VALTREX) 1000 MG tablet Take 1 tablet (1,000  mg total) by mouth 3 (three) times daily. 05/27/15   Rowe Clack, MD   Meds Ordered and Administered this Visit  Medications - No data to display  BP 116/73 mmHg  Pulse 57  Temp(Src) 98.1 F (36.7 C) (Oral)  Ht 5\' 2"  (1.575 m)  Wt 105 lb (47.628 kg)  BMI 19.20 kg/m2  SpO2 100% No data found.   Physical Exam  Constitutional: She appears well-developed and well-nourished. No distress.  HENT:  Head: Atraumatic.  Eyes: Conjunctivae and EOM are normal. Pupils are equal, round, and reactive to light. Lids are everted and swept, no foreign bodies found. Right eye exhibits no discharge. Left eye exhibits no chemosis, no discharge, no exudate and no hordeolum. No foreign body present in the left eye.  Left eye examined after instillation of  tetracaine ophthalmic solution.  Left upper eyelid minimally swollen, with tenderness to palpation.  Lid eversion reveals no foreign body.  Fluorescein reveals no evidence corneal abrasion.  Nursing note and vitals reviewed.   ED Course  Procedures  None   Visual Acuity Review  Right Eye Distance: 20/70 Left Eye Distance: Can't see Bilateral Distance: 20/30 (without correction)    MDM   1. Foreign body of left eye, initial encounter; (foreign body no longer present)   Relatively normal left eye exam except for decreased vision and mild tenderness left upper lid.  No evidence retained foreign body or corneal abrasion Begin Acular ophthalmic solution 4 times daily. Apply cool compress to left eye every 3 to 4 hours until improved.  May continue lubricating eye drops as needed. If symptoms worsen or do not improve within 3 days, recommend follow-up with ophthalmologist (will need to go to ER during holiday).    Kandra Nicolas, MD 07/03/15 [phone]

## 2015-07-06 ENCOUNTER — Telehealth: Payer: Self-pay | Admitting: *Deleted

## 2015-07-27 ENCOUNTER — Other Ambulatory Visit (HOSPITAL_COMMUNITY): Payer: Self-pay | Admitting: Radiology

## 2015-07-27 DIAGNOSIS — I729 Aneurysm of unspecified site: Secondary | ICD-10-CM

## 2015-07-27 DIAGNOSIS — C439 Malignant melanoma of skin, unspecified: Secondary | ICD-10-CM

## 2015-07-30 ENCOUNTER — Ambulatory Visit (HOSPITAL_COMMUNITY)
Admission: RE | Admit: 2015-07-30 | Discharge: 2015-07-30 | Disposition: A | Discharge status: Home / Self Care | Payer: 59 | Source: Ambulatory Visit | Attending: Nurse Practitioner | Admitting: Nurse Practitioner

## 2015-07-30 DIAGNOSIS — I671 Cerebral aneurysm, nonruptured: Secondary | ICD-10-CM | POA: Insufficient documentation

## 2015-07-30 DIAGNOSIS — I729 Aneurysm of unspecified site: Secondary | ICD-10-CM | POA: Diagnosis present

## 2015-07-30 DIAGNOSIS — C439 Malignant melanoma of skin, unspecified: Secondary | ICD-10-CM

## 2015-10-19 ENCOUNTER — Encounter: Payer: Self-pay | Admitting: *Deleted

## 2015-10-19 ENCOUNTER — Emergency Department
Admission: EM | Admit: 2015-10-19 | Discharge: 2015-10-19 | Disposition: A | Discharge status: Home / Self Care | Payer: 59 | Source: Home / Self Care | Attending: Family Medicine | Admitting: Family Medicine

## 2015-10-19 DIAGNOSIS — J111 Influenza due to unidentified influenza virus with other respiratory manifestations: Secondary | ICD-10-CM | POA: Diagnosis not present

## 2015-10-19 DIAGNOSIS — J029 Acute pharyngitis, unspecified: Secondary | ICD-10-CM

## 2015-10-19 DIAGNOSIS — R69 Illness, unspecified: Principal | ICD-10-CM

## 2015-10-19 LAB — POCT INFLUENZA A/B
Influenza A, POC: NEGATIVE
Influenza B, POC: NEGATIVE

## 2015-10-19 LAB — POCT RAPID STREP A (OFFICE): Rapid Strep A Screen: NEGATIVE

## 2015-10-19 MED ORDER — OSELTAMIVIR PHOSPHATE 75 MG PO CAPS: 75.0000 mg | ORAL_CAPSULE | Freq: Two times a day (BID) | ORAL | Status: DC

## 2015-10-19 NOTE — ED Provider Notes (Signed)
CSN: [id number]     Arrival date & time 10/19/15  1906 History   First MD Initiated Contact with Patient 10/19/15 1924     Chief Complaint  Patient presents with  . Sore Throat      HPI Comments: Complains of awakening this morning with flu-like symptoms  including myalgias, headache, chills, fatigue, and mild cough.  She has had no nasal congestion or sore throat.  Cough is non-productive.  No pleuritic pain or shortness of breath.  She has had a flu shot this season.    The history is provided by the patient.    Past Medical History  Diagnosis Date  . Melanoma ([place]) 2013    RLE, follows with derm for same  . IBS (irritable bowel syndrome)   . Heart palpitations     holter 10/2013 with few PVCs/PACs, normal stress echo 12/2013  . 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 Sjogren's syndrome ([place]) 07/23/2014    Dx summer 2015 -follows with rheum for same  . Brain aneurysm 09/2012 dx    follows at [facility] for same   Past Surgical History  Procedure Laterality Date  . Speenoidectomy    . Lasik    . Abdominal hysterectomy    . Right leg fx    . Wisdom tooth extraction    . Breast biopsy Left   . Colonoscopy    . Cholecystectomy N/A 04/10/2015    Procedure: LAPAROSCOPIC CHOLECYSTECTOMY WITH INTRAOPERATIVE CHOLANGIOGRAM;  Surgeon: Jackolyn Confer, MD;  Location: [facility] OR;  Service: General;  Laterality: N/A;   Family History  Problem Relation Age of Onset  . Rheum arthritis Mother   . Diabetes Father   . Hypertension Father   . Heart disease Father 19    MI with CABG x 5 age 79  . Esophageal cancer Paternal Aunt   . Rectal cancer Neg Hx   . Stomach cancer Neg Hx   . Colon cancer Cousin   . Pancreatic cancer Cousin   . Heart attack Brother 75   Social History  Substance Use Topics  . Smoking status: Never Smoker   . Smokeless tobacco: Never Used  . Alcohol Use: No   OB History    No data available     Review of Systems No sore throat No mild cough No pleuritic pain No  wheezing No nasal congestion No post-nasal drainage No sinus pain/pressure No itchy/red eyes No earache No hemoptysis No SOB No fever, + chills No nausea No vomiting No abdominal pain No diarrhea No urinary symptoms No skin rash + fatigue + myalgias + headache Used OTC meds without relief  Allergies  Morphine and related; Coppertone spf4; Sulfa antibiotics; and Latex  Home Medications   Prior to Admission medications   Medication Sig Start Date End Date Taking? Authorizing Provider  Probiotic Product (PROBIOTIC PO) Take by mouth.   Yes Historical Provider, MD  ketorolac (ACULAR) 0.5 % ophthalmic solution Place 1 drop into the left eye 4 (four) times daily. 07/03/15   Kandra Nicolas, MD  omega-3 acid ethyl esters (LOVAZA) 1 G capsule Take 1 g by mouth daily.    Historical Provider, MD  oseltamivir (TAMIFLU) 75 MG capsule Take 1 capsule (75 mg total) by mouth every 12 (twelve) hours. 10/19/15   Kandra Nicolas, MD  Polyethyl Glycol-Propyl Glycol 0.4-0.3 % SOLN Place 3 drops into both eyes 3 (three) times daily as needed (dry eyes). Systane    Historical Provider, MD  valACYclovir (VALTREX) 1000 MG tablet Take 1 tablet (1,000 mg total) by mouth 3 (three) times daily. 05/27/15   Rowe Clack, MD   Meds Ordered and Administered this Visit  Medications - No data to display  BP 104/68 mmHg  Pulse 105  Temp(Src) 98.9 F (37.2 C) (Oral)  Resp 16  Ht 5' 2.25" (1.581 m)  Wt 109 lb (49.442 kg)  BMI 19.78 kg/m2  SpO2 98% No data found.   Physical Exam Nursing notes and Vital Signs reviewed. Appearance:  Patient appears stated age, and in no acute distress Eyes:  Pupils are equal, round, and reactive to light and accomodation.  Extraocular movement is intact.  Conjunctivae are not inflamed  Ears:  Canals normal.  Tympanic membranes normal.  Nose:  Mildly congested turbinates.  No sinus tenderness.   Pharynx:  Uvula slightly swollen/minimal erythema Neck:  Supple.  Tender  enlarged posterior nodes are palpated bilaterally  Lungs:  Clear to auscultation.  Breath sounds are equal.  Moving air well. Chest:  Distinct tenderness to palpation over the mid-sternum.  Heart:  Regular rate and rhythm without murmurs, rubs, or gallops.  Abdomen:  Nontender without masses or hepatosplenomegaly.  Bowel sounds are present.  No CVA or flank tenderness.  Extremities:  No edema.  No calf tenderness Skin:  No rash present.   ED Course  Procedures none    Labs Reviewed  POCT RAPID STREP A (OFFICE) negative  POCT INFLUENZA A/B negative     MDM   1. Influenza-like illness; ?false negative flu test    Begin Tamiflu  Take plain guaifenesin (1200mg  extended release tabs such as Mucinex) twice daily, with plenty of water, if cough and congestion develop.  May add Pseudoephedrine (30mg , one or two every 4 to 6 hours) for sinus congestion.  Get adequate rest.   May use Afrin nasal spray (or generic oxymetazoline) twice daily for about 5 days and then discontinue.  Also recommend using saline nasal spray several times daily and saline nasal irrigation (AYR is a common brand).  Try warm salt water gargles for sore throat.  Stop all antihistamines for now, and other non-prescription cough/cold preparations. May take Ibuprofen 200mg , 4 tabs every 8 hours with food for body aches, fever, headache, etc. May take Delsym Cough Suppressant at bedtime for nighttime cough.  Follow-up with family doctor if not improving about 7 to10 days.    Kandra Nicolas, MD 10/24/15 [phone]

## 2015-10-19 NOTE — Discharge Instructions (Signed)
Take plain guaifenesin (1200mg  extended release tabs such as Mucinex) twice daily, with plenty of water, if cough and congestion develop.  May add Pseudoephedrine (30mg , one or two every 4 to 6 hours) for sinus congestion.  Get adequate rest.   May use Afrin nasal spray (or generic oxymetazoline) twice daily for about 5 days and then discontinue.  Also recommend using saline nasal spray several times daily and saline nasal irrigation (AYR is a common brand).  Try warm salt water gargles for sore throat.  Stop all antihistamines for now, and other non-prescription cough/cold preparations. May take Ibuprofen 200mg , 4 tabs every 8 hours with food for body aches, fever, headache, etc. May take Delsym Cough Suppressant at bedtime for nighttime cough.  Follow-up with family doctor if not improving about 7 to10 days.

## 2015-10-19 NOTE — ED Notes (Signed)
Pt c/o chills, body aches and sore throat x today. Denies dysuria or fever.

## 2015-10-20 ENCOUNTER — Telehealth: Payer: Self-pay | Admitting: *Deleted

## 2015-10-20 LAB — STREP A DNA PROBE: GASP: NOT DETECTED

## 2015-10-20 NOTE — ED Notes (Unsigned)
spoke to pt given Tcx results. she reports that she still has body aches and that she passed out during the night. advised her to increase fluids, f/u here or with PCP if she fails to improve or to proceed to the ED for further eval if she worsens or has another episode of LOC. Dr Assunta Found agrees and pt voiced understanding. Charna Archer, LPN

## 2016-01-22 ENCOUNTER — Other Ambulatory Visit: Payer: Self-pay

## 2016-01-22 DIAGNOSIS — Z1231 Encounter for screening mammogram for malignant neoplasm of breast: Secondary | ICD-10-CM

## 2016-03-02 ENCOUNTER — Ambulatory Visit
Admission: RE | Admit: 2016-03-02 | Discharge: 2016-03-02 | Disposition: A | Discharge status: Home / Self Care | Payer: 59 | Source: Ambulatory Visit

## 2016-03-02 DIAGNOSIS — Z1231 Encounter for screening mammogram for malignant neoplasm of breast: Secondary | ICD-10-CM | POA: Diagnosis not present

## 2016-04-04 DIAGNOSIS — Z6826 Body mass index (BMI) 26.0-26.9, adult: Secondary | ICD-10-CM | POA: Diagnosis not present

## 2016-04-04 DIAGNOSIS — Z01419 Encounter for gynecological examination (general) (routine) without abnormal findings: Secondary | ICD-10-CM | POA: Diagnosis not present

## 2016-05-26 ENCOUNTER — Ambulatory Visit: Payer: 59 | Admitting: Internal Medicine

## 2016-06-27 DIAGNOSIS — L814 Other melanin hyperpigmentation: Secondary | ICD-10-CM | POA: Diagnosis not present

## 2016-06-27 DIAGNOSIS — D2262 Melanocytic nevi of left upper limb, including shoulder: Secondary | ICD-10-CM | POA: Diagnosis not present

## 2016-06-27 DIAGNOSIS — D225 Melanocytic nevi of trunk: Secondary | ICD-10-CM | POA: Diagnosis not present

## 2016-07-11 ENCOUNTER — Other Ambulatory Visit (HOSPITAL_COMMUNITY): Payer: Self-pay | Admitting: Neurosurgery

## 2016-07-11 DIAGNOSIS — I671 Cerebral aneurysm, nonruptured: Secondary | ICD-10-CM

## 2016-07-12 ENCOUNTER — Ambulatory Visit (HOSPITAL_COMMUNITY)
Admission: RE | Admit: 2016-07-12 | Discharge: 2016-07-12 | Disposition: A | Discharge status: Home / Self Care | Payer: 59 | Source: Ambulatory Visit | Attending: Neurosurgery | Admitting: Neurosurgery

## 2016-07-12 DIAGNOSIS — I671 Cerebral aneurysm, nonruptured: Secondary | ICD-10-CM | POA: Diagnosis not present

## 2016-07-12 DIAGNOSIS — I720 Aneurysm of carotid artery: Secondary | ICD-10-CM | POA: Diagnosis not present

## 2016-07-15 ENCOUNTER — Encounter: Payer: Self-pay | Admitting: Student

## 2016-07-18 ENCOUNTER — Telehealth: Payer: Self-pay | Admitting: Family

## 2016-07-18 ENCOUNTER — Other Ambulatory Visit: Payer: Self-pay | Admitting: Internal Medicine

## 2016-07-18 MED ORDER — VALACYCLOVIR HCL 1 G PO TABS: 1000.0000 mg | ORAL_TABLET | Freq: Three times a day (TID) | ORAL | 0 refills | Status: DC

## 2016-07-18 MED FILL — valACYclovir HCL 1 GM TABS: 1 | 7 days supply | Qty: 21 | Fill #0

## 2016-07-18 NOTE — Telephone Encounter (Signed)
Medication sent to pharmacy  

## 2016-07-18 NOTE — Telephone Encounter (Signed)
Please advise 

## 2016-07-18 NOTE — Telephone Encounter (Signed)
Patient is transferring to [facility] on Friday the 22nd.  Please send refill of valacyclovir to [facility] to get patient through until then.

## 2016-07-19 NOTE — Telephone Encounter (Signed)
Left vm for patient

## 2016-07-20 ENCOUNTER — Emergency Department
Admission: EM | Admit: 2016-07-20 | Discharge: 2016-07-20 | Disposition: A | Discharge status: Home / Self Care | Payer: 59 | Source: Home / Self Care | Attending: Family Medicine | Admitting: Family Medicine

## 2016-07-20 DIAGNOSIS — J111 Influenza due to unidentified influenza virus with other respiratory manifestations: Secondary | ICD-10-CM

## 2016-07-20 DIAGNOSIS — R69 Illness, unspecified: Principal | ICD-10-CM

## 2016-07-20 DIAGNOSIS — M940 Chondrocostal junction syndrome [Tietze]: Secondary | ICD-10-CM

## 2016-07-20 LAB — POCT RAPID STREP A (OFFICE): Rapid Strep A Screen: NEGATIVE

## 2016-07-20 LAB — POCT INFLUENZA A/B
Influenza A, POC: NEGATIVE
Influenza B, POC: NEGATIVE

## 2016-07-20 MED ORDER — IBUPROFEN 200 MG PO TABS
200.0000 mg | ORAL_TABLET | Freq: Once | ORAL | Status: AC
  Administered 2016-07-20: 200 mg via ORAL

## 2016-07-20 MED ORDER — OSELTAMIVIR PHOSPHATE 75 MG PO CAPS: 75.0000 mg | ORAL_CAPSULE | Freq: Two times a day (BID) | ORAL | 0 refills | Status: DC

## 2016-07-20 NOTE — ED Provider Notes (Signed)
[facility]    CSN: [id number] Arrival date & time: 07/20/16  1624  First Provider Contact:  First MD Initiated Contact with Patient 07/20/16 1711        History   Chief Complaint Chief Complaint  Patient presents with  . Sore Throat  . Dizziness  . Fever  . Nausea    HPI Stefanie [name] is a 50 y.o. female.   Complains of 1 day history flu-like illness including myalgias, headache, chills/sweats, fatigue, and cough.  Also has mild nasal congestion and sore throat.  Cough is non-productive and somewhat worse at night.  No pleuritic pain or shortness of breath.  She states that a person at work was diagnosed with influenza B.  She has had mild nausea without vomiting.    The history is provided by the patient.    Past Medical History:  Diagnosis Date  . Brain aneurysm 09/2012 dx   follows at [facility] for same  . Heart palpitations    holter 10/2013 with few PVCs/PACs, normal stress echo 12/2013  . IBS (irritable bowel syndrome)   . Melanoma ([place]) 2013   RLE, follows with derm for same  . 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 Sjogren's syndrome ([place]) 07/23/2014   Dx summer 2015 -follows with rheum for same    Patient Active Problem List   Diagnosis Date Noted  . Sjogren's syndrome ([place]) 07/23/2014  . 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 ABDOMINAL HYSTERECTOMY    . BREAST BIOPSY Left   . CHOLECYSTECTOMY N/A 04/10/2015   Procedure: LAPAROSCOPIC CHOLECYSTECTOMY WITH INTRAOPERATIVE CHOLANGIOGRAM;  Surgeon: Jackolyn Confer, MD;  Location: [facility];  Service: General;  Laterality: N/A;  . COLONOSCOPY    . LASIK    . right leg fx    . speenoidectomy    . WISDOM TOOTH EXTRACTION      OB History    No data available       Home Medications    Prior to Admission medications   Medication Sig Start Date End Date Taking? Authorizing Provider  ketorolac (ACULAR) 0.5 %  ophthalmic solution Place 1 drop into the left eye 4 (four) times daily. 07/03/15   Kandra Nicolas, MD  omega-3 acid ethyl esters (LOVAZA) 1 G capsule Take 1 g by mouth daily.    Historical Provider, MD  oseltamivir (TAMIFLU) 75 MG capsule Take 1 capsule (75 mg total) by mouth every 12 (twelve) hours. 07/20/16   Kandra Nicolas, MD  Polyethyl Glycol-Propyl Glycol 0.4-0.3 % SOLN Place 3 drops into both eyes 3 (three) times daily as needed (dry eyes). Systane    Historical Provider, MD  Probiotic Product (PROBIOTIC PO) Take by mouth.    Historical Provider, MD  valACYclovir (VALTREX) 1000 MG tablet Take 1 tablet (1,000 mg total) by mouth 3 (three) times daily. 07/18/16   [place], FNP    Family History Family History  Problem Relation Age of Onset  . Rheum arthritis Mother   . Diabetes Father   . Hypertension Father   . Heart disease Father 28    MI with CABG x 5 age 50  . Esophageal cancer Paternal Aunt   . Colon cancer Cousin   . Pancreatic cancer Cousin   . Heart attack Brother 85  . Rectal cancer Neg Hx   . Stomach cancer Neg Hx     Social History  Social History  Substance Use Topics  . Smoking status: Never Smoker  . Smokeless tobacco: Never Used  . Alcohol use No     Allergies   Morphine and related; Coppertone spf4; Sulfa antibiotics; and Latex   Review of Systems Review of Systems  + sore throat + cough No pleuritic pain No wheezing + nasal congestion + post-nasal drainage No sinus pain/pressure No itchy/red eyes No earache + dizzy No hemoptysis No SOB No fever, + chills/sweats + nausea No vomiting No abdominal pain No diarrhea No urinary symptoms No skin rash + fatigue + myalgias + headache Used OTC meds without relief    Physical Exam Triage Vital Signs ED Triage Vitals  Enc Vitals Group     BP 07/20/16 1710 103/67     Pulse Rate 07/20/16 1710 94     Resp --      Temp 07/20/16 1710 98.8 F (37.1 C)     Temp Source 07/20/16 1710  Oral     SpO2 07/20/16 1710 96 %     Weight 07/20/16 1711 105 lb (47.6 kg)     Height 07/20/16 1711 5\' 2"  (1.575 m)     Head Circumference --      Peak Flow --      Pain Score 07/20/16 1712 7     Pain Loc --      Pain Edu? --      Excl. in [place]? --    No data found.   Updated Vital Signs BP 103/67 (BP Location: Left Arm)   Pulse 94   Temp 98.8 F (37.1 C) (Oral)   Ht 5\' 2"  (1.575 m)   Wt 105 lb (47.6 kg)   SpO2 96%   BMI 19.20 kg/m   Visual Acuity Right Eye Distance:   Left Eye Distance:   Bilateral Distance:    Right Eye Near:   Left Eye Near:    Bilateral Near:     Physical Exam Nursing notes and Vital Signs reviewed. Appearance:  Patient appears stated age, and in no acute distress Eyes:  Pupils are equal, round, and reactive to light and accomodation.  Extraocular movement is intact.  Conjunctivae are not inflamed  Ears:  Canals normal.  Tympanic membranes normal.  Nose:  Mildly congested turbinates.  No sinus tenderness.   Pharynx:  Uvula slightly erythematous Neck:  Supple.  Tender enlarged posterior/lateral nodes are palpated bilaterally  Lungs:  Clear to auscultation.  Breath sounds are equal.  Moving air well. Chest:  Distinct tenderness to palpation over the mid-sternum.  Heart:  Regular rate and rhythm without murmurs, rubs, or gallops.  Abdomen:  Nontender without masses or hepatosplenomegaly.  Bowel sounds are present.  No CVA or flank tenderness.  Extremities:  No edema.  Skin:  No rash present.    UC Treatments / Results  Labs (all labs ordered are listed, but only abnormal results are displayed) Labs Reviewed  POCT RAPID STREP A (OFFICE) negative  POCT INFLUENZA A/B negative    EKG  EKG Interpretation None       Radiology No results found.  Procedures Procedures (including critical care time)  Medications Ordered in UC Medications  ibuprofen (ADVIL,MOTRIN) tablet 200 mg (200 mg Oral Given 07/20/16 1731)     Initial Impression /  Assessment and Plan / UC Course  I have reviewed the triage vital signs and the nursing notes.  Pertinent labs & imaging results that were available during my care of the patient were reviewed  by me and considered in my medical decision making (see chart for details).  Clinical Course  Begin empiric Tamiflu. Take plain guaifenesin (1200mg  extended release tabs such as Mucinex) twice daily, with plenty of water, for cough and congestion.   Get adequate rest.   Also recommend using saline nasal spray several times daily and saline nasal irrigation (AYR is a common brand).    Try warm salt water gargles for sore throat.  Stop all antihistamines for now, and other non-prescription cough/cold preparations. May take Ibuprofen 200mg , 3 or 4 tabs every 8 hours with food for chest/sternum discomfort. May take Delsym Cough Suppressant at bedtime for nighttime cough.  Follow-up with family doctor if not improving about one week. Recommend a flu shot when well.      Final Clinical Impressions(s) / UC Diagnoses   Final diagnoses:  Influenza-like illness  Costochondritis, acute    New Prescriptions Current Discharge Medication List       Kandra Nicolas, MD 07/28/16 [phone]

## 2016-07-20 NOTE — Discharge Instructions (Signed)
Take plain guaifenesin (1200mg  extended release tabs such as Mucinex) twice daily, with plenty of water, for cough and congestion.   Get adequate rest.   Also recommend using saline nasal spray several times daily and saline nasal irrigation (AYR is a common brand).    Try warm salt water gargles for sore throat.  Stop all antihistamines for now, and other non-prescription cough/cold preparations. May take Ibuprofen 200mg , 3 or 4 tabs every 8 hours with food for chest/sternum discomfort. May take Delsym Cough Suppressant at bedtime for nighttime cough.  Follow-up with family doctor if not improving about one week.

## 2016-07-20 NOTE — ED Triage Notes (Signed)
St stated that she started with Head ache, body ache, sore throat, nausea and fever yesterday around 2 pm.  Has had dayquil today and feels a little less achy.

## 2016-07-22 ENCOUNTER — Ambulatory Visit: Payer: 59 | Admitting: Family

## 2016-07-22 ENCOUNTER — Telehealth: Payer: Self-pay | Admitting: Emergency Medicine

## 2016-07-22 NOTE — Telephone Encounter (Signed)
Routing to Stefanie [name]---please advise, I will call patient back, thanks

## 2016-07-22 NOTE — Telephone Encounter (Signed)
Pt called and states she has an appt with you in oct. She wants to know if she can get a referral to [facility] to see Dr Johnette Abraham Richarda Overlie. Please advise thanks.

## 2016-08-01 DIAGNOSIS — B078 Other viral warts: Secondary | ICD-10-CM | POA: Diagnosis not present

## 2016-08-01 NOTE — Telephone Encounter (Signed)
Patient is calling again about this. Please follow up with patient. Thank you.

## 2016-08-01 NOTE — Telephone Encounter (Signed)
I have added rheumatology referral request to patient's office visit note for [place] on 08/17/16

## 2016-08-02 DIAGNOSIS — R51 Headache: Secondary | ICD-10-CM | POA: Diagnosis not present

## 2016-08-02 DIAGNOSIS — I720 Aneurysm of carotid artery: Secondary | ICD-10-CM | POA: Diagnosis not present

## 2016-08-17 ENCOUNTER — Encounter: Payer: Self-pay | Admitting: Nurse Practitioner

## 2016-08-17 ENCOUNTER — Ambulatory Visit (INDEPENDENT_AMBULATORY_CARE_PROVIDER_SITE_OTHER): Payer: 59 | Admitting: Nurse Practitioner

## 2016-08-17 ENCOUNTER — Other Ambulatory Visit (INDEPENDENT_AMBULATORY_CARE_PROVIDER_SITE_OTHER): Payer: 59

## 2016-08-17 ENCOUNTER — Encounter: Payer: Self-pay | Admitting: *Deleted

## 2016-08-17 ENCOUNTER — Other Ambulatory Visit: Payer: Self-pay | Admitting: *Deleted

## 2016-08-17 VITALS — BP 109/86 | HR 88 | Temp 97.6°F | Ht 62.0 in | Wt 109.0 lb

## 2016-08-17 DIAGNOSIS — M791 Myalgia, unspecified site: Secondary | ICD-10-CM

## 2016-08-17 DIAGNOSIS — Z0000 Encounter for general adult medical examination without abnormal findings: Secondary | ICD-10-CM

## 2016-08-17 DIAGNOSIS — R5382 Chronic fatigue, unspecified: Secondary | ICD-10-CM

## 2016-08-17 DIAGNOSIS — G9332 Myalgic encephalomyelitis/chronic fatigue syndrome: Secondary | ICD-10-CM | POA: Insufficient documentation

## 2016-08-17 DIAGNOSIS — M2550 Pain in unspecified joint: Secondary | ICD-10-CM | POA: Diagnosis not present

## 2016-08-17 DIAGNOSIS — Z0001 Encounter for general adult medical examination with abnormal findings: Secondary | ICD-10-CM

## 2016-08-17 DIAGNOSIS — M3500 Sicca syndrome, unspecified: Secondary | ICD-10-CM | POA: Diagnosis not present

## 2016-08-17 DIAGNOSIS — B009 Herpesviral infection, unspecified: Secondary | ICD-10-CM

## 2016-08-17 DIAGNOSIS — M797 Fibromyalgia: Secondary | ICD-10-CM | POA: Insufficient documentation

## 2016-08-17 LAB — CBC WITH DIFFERENTIAL/PLATELET
Basophils Absolute: 0 10*3/uL (ref 0.0–0.1)
Basophils Relative: 0.5 % (ref 0.0–3.0)
Eosinophils Absolute: 0.3 10*3/uL (ref 0.0–0.7)
Eosinophils Relative: 3.4 % (ref 0.0–5.0)
HCT: 42.5 % (ref 36.0–46.0)
Hemoglobin: 14.4 g/dL (ref 12.0–15.0)
Lymphocytes Relative: 43.1 % (ref 12.0–46.0)
Lymphs Abs: 3.3 10*3/uL (ref 0.7–4.0)
MCHC: 34 g/dL (ref 30.0–36.0)
MCV: 85.1 fl (ref 78.0–100.0)
Monocytes Absolute: 0.6 10*3/uL (ref 0.1–1.0)
Monocytes Relative: 7.9 % (ref 3.0–12.0)
Neutro Abs: 3.5 10*3/uL (ref 1.4–7.7)
Neutrophils Relative %: 45.1 % (ref 43.0–77.0)
Platelets: 270 10*3/uL (ref 150.0–400.0)
RBC: 4.99 Mil/uL (ref 3.87–5.11)
RDW: 13.3 % (ref 11.5–15.5)
WBC: 7.7 10*3/uL (ref 4.0–10.5)

## 2016-08-17 LAB — TSH: TSH: 5.09 u[IU]/mL — ABNORMAL HIGH (ref 0.35–4.50)

## 2016-08-17 LAB — COMPREHENSIVE METABOLIC PANEL
ALT: 17 U/L (ref 0–35)
AST: 20 U/L (ref 0–37)
Albumin: 5 g/dL (ref 3.5–5.2)
Alkaline Phosphatase: 61 U/L (ref 39–117)
BUN: 12 mg/dL (ref 6–23)
CO2: 30 mEq/L (ref 19–32)
Calcium: 10.1 mg/dL (ref 8.4–10.5)
Chloride: 103 mEq/L (ref 96–112)
Creatinine, Ser: 0.71 mg/dL (ref 0.40–1.20)
GFR: 92.69 mL/min (ref >60.00)
Glucose, Bld: 89 mg/dL (ref 70–99)
Potassium: 4.1 mEq/L (ref 3.5–5.1)
Sodium: 140 mEq/L (ref 135–145)
Total Bilirubin: 0.4 mg/dL (ref 0.2–1.2)
Total Protein: 7.7 g/dL (ref 6.0–8.3)

## 2016-08-17 LAB — LIPID PANEL
Cholesterol: 227 mg/dL — ABNORMAL HIGH (ref 0–200)
HDL: 76.2 mg/dL (ref >39.00)
LDL Cholesterol: 133 mg/dL — ABNORMAL HIGH (ref 0–99)
NonHDL: 151
Total CHOL/HDL Ratio: 3
Triglycerides: 92 mg/dL (ref 0.0–149.0)
VLDL: 18.4 mg/dL (ref 0.0–40.0)

## 2016-08-17 MED ORDER — VALACYCLOVIR HCL 1 G PO TABS: 1000.0000 mg | ORAL_TABLET | Freq: Three times a day (TID) | ORAL | 1 refills | Status: DC

## 2016-08-17 MED FILL — valACYclovir HCL 1 GM TABS: 1 | 7 days supply | Qty: 21 | Fill #0

## 2016-08-17 NOTE — Progress Notes (Signed)
Subjective:    Patient ID: Stefanie [name], female    DOB: [birth year], 50 y.o.   MRN: [id number]  Patient presents today for complete physical and needs referral to new rheumatologist.  HPI Denies any acute complaint. Joint pain and stiffness, ongoing for several years, same all day, worse with activity, was evaluated by another rheumatologist and prescribed a biologic agent, she does not want medication at this time, she wants second opinion.  Immunizations: (TDAP, Hep C screen, Pneumovax, Influenza, zoster)  Health Maintenance  Topic Date Due  . Flu Shot  05/31/2016  . Pap Smear  04/05/2018  . Tetanus Vaccine  05/26/2025  . HIV Screening  Completed   Diet:healthy Weight:  Wt Readings from Last 3 Encounters:  08/17/16 109 lb (49.4 kg)  07/20/16 105 lb (47.6 kg)  10/19/15 109 lb (49.4 kg)   Exercise:none Depression/Suicide:denies Pap Smear (every 28yr for >21-29 without HPV, every 557yrfor >30-6576yrith HPV):up to date Mammogram (yearly, >45y66yrp to date Vision:up to date Dental:up to date Advanced Directive: Advanced Directives 10/01/2013  Does patient have an advance directive? Patient would like information;Patient does not have advance directive  Would patient like information on creating an advanced directive? Advance directive packet given  Pre-existing out of facility DNR order (yellow form or pink MOST form) No    Medications and allergies reviewed with patient and updated if appropriate.  Patient Active Problem List   Diagnosis Date Noted  . Myalgia 08/17/2016  . Arthralgia 08/17/2016  . Sjogren's syndrome (HCC)[place]/23/2015  . 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 Aneurysm of left carotid artery (HCC)[place]/25/2013    Current Outpatient Prescriptions on File Prior to Visit  Medication Sig Dispense Refill  . ketorolac (ACULAR) 0.5 % ophthalmic solution Place 1 drop into the left  eye 4 (four) times daily. 3 mL 0  . Polyethyl Glycol-Propyl Glycol 0.4-0.3 % SOLN Place 3 drops into both eyes 3 (three) times daily as needed (dry eyes). Systane     No current facility-administered medications on file prior to visit.     Past Medical History:  Diagnosis Date  . Brain aneurysm 09/2012 dx   [facility]  . Heart palpitations    holter 10/2013 with few PVCs/PACs, normal stress echo 12/2013  . IBS (irritable bowel syndrome)   . Melanoma (HCC)[place]13   RLE, follows with derm for same  . 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 Sjogren's syndrome (HCC)[place]23/2015   Dx summer 2015 -follows with rheum for same    Past Surgical History:  Procedure Laterality Date  . ABDOMINAL HYSTERECTOMY    . BREAST BIOPSY Left   . CHOLECYSTECTOMY N/A 04/10/2015   Procedure: LAPAROSCOPIC CHOLECYSTECTOMY WITH INTRAOPERATIVE CHOLANGIOGRAM;  Surgeon: ToddJackolyn Confer;  [facility];  Laterality: N/A;  . COLONOSCOPY    . GALLBLADDER SURGERY  04/2015  . LASIK    . right leg fx    . speenoidectomy    . WISDOM TOOTH EXTRACTION      Social History   Social History  . Marital status: Married    Spouse name: N/A  . Number of children: 2  . Years of education: N/A   Occupational History  . 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   Social History Main Topics  . Smoking status: Never Smoker  . Smokeless tobacco: Never Used  . Alcohol use  No  . Drug use: No  . Sexual activity: Yes    Birth control/ protection: None   Other Topics Concern  . None   Social History Narrative  . None    Family History  Problem Relation Age of Onset  . Rheum arthritis Mother   . Arthritis Mother   . Diabetes Father   . Heart disease Father 72    MI with CABG x 5 age 25  . Stroke Father   . Cancer Paternal Aunt   . Pancreatic cancer Cousin   . Heart attack Brother 34  . Cancer Maternal Aunt   . Esophageal cancer Paternal Uncle   . Rectal cancer Neg Hx   .  Stomach cancer Neg Hx         Review of Systems  Constitutional: Negative for fever, malaise/fatigue and weight loss.  HENT: Negative for congestion and sore throat.   Eyes:       Negative for visual changes  Respiratory: Negative for cough and shortness of breath.   Cardiovascular: Negative for chest pain, palpitations and leg swelling.  Gastrointestinal: Negative for blood in stool, constipation, diarrhea and heartburn.  Genitourinary: Negative for dysuria, frequency and urgency.  Musculoskeletal: Positive for joint pain and myalgias. Negative for falls.  Skin: Negative for rash.  Neurological: Negative for dizziness, sensory change and headaches.  Endo/Heme/Allergies: Does not bruise/bleed easily.  Psychiatric/Behavioral: Negative for depression, substance abuse and suicidal ideas. The patient is not nervous/anxious.     Objective:   Vitals:   08/17/16 1106  BP: 109/86  Pulse: 88  Temp: 97.6 F (36.4 C)    Body mass index is 19.94 kg/m.   Physical Examination:  Physical Exam  Constitutional: She is oriented to person, place, and time and well-developed, well-nourished, and in no distress. No distress.  HENT:  Right Ear: External ear normal.  Left Ear: External ear normal.  Nose: Nose normal.  Mouth/Throat: Oropharynx is clear and moist. No oropharyngeal exudate.  Eyes: Conjunctivae and EOM are normal. Pupils are equal, round, and reactive to light. No scleral icterus.  Neck: Normal range of motion. Neck supple. No thyromegaly present.  Cardiovascular: Normal rate, normal heart sounds and intact distal pulses.   Pulmonary/Chest: Effort normal and breath sounds normal. She exhibits no tenderness.  Abdominal: Soft. Bowel sounds are normal. She exhibits no distension. There is no tenderness.  Musculoskeletal: Normal range of motion. She exhibits no edema or tenderness.  Lymphadenopathy:    She has no cervical adenopathy.  Neurological: She is alert and oriented to  person, place, and time. Gait normal.  Skin: Skin is warm and dry.  Psychiatric: Affect and judgment normal.    ASSESSMENT and PLAN:  Stefanie [name] was seen today for annual exam.  Diagnoses and all orders for this visit:  Encounter for preventative adult health care examination -     CBC w/Diff; Future -     Comp Met (CMET); Future -     TSH; Future -     Lipid panel; Future  Sjogren's syndrome, with unspecified organ involvement ([place]) -     Ambulatory referral to Rheumatology  Myalgia -     CBC w/Diff; Future -     Comp Met (CMET); Future -     TSH; Future  Arthralgia, unspecified joint -     CBC w/Diff; Future -     Comp Met (CMET); Future -     TSH; Future  Other orders -     valACYclovir (  VALTREX) 1000 MG tablet; Take 1 tablet (1,000 mg total) by mouth 3 (three) times daily.    No problem-specific Assessment & Plan notes found for this encounter.      Follow up: Return in about 1 year (around 08/17/2017) for CPE.  Wilfred Lacy, NP

## 2016-08-17 NOTE — Progress Notes (Signed)
Pre visit review using our clinic review tool, if applicable. No additional management support is needed unless otherwise documented below in the visit note. 

## 2016-08-19 ENCOUNTER — Telehealth: Payer: Self-pay | Admitting: Nurse Practitioner

## 2016-08-19 NOTE — Telephone Encounter (Signed)
I do not have any medical reason to provide a letter indicating that she can not receive the influenza vaccine. Thank you

## 2016-08-19 NOTE — Telephone Encounter (Signed)
Pt contacted and gave providers response.

## 2016-08-19 NOTE — Telephone Encounter (Signed)
Patient is requesting that she get a letter saying she will not be getting the flu shot until she see the doctor they referred her too at [facility]. Please follow up with patient. Thank you.

## 2016-08-19 NOTE — Telephone Encounter (Signed)
Pt wants a letter to state pt is able to wait for flu vaccine until after she sees MD at [facility].  Fax number to send letter to [phone]

## 2016-08-19 NOTE — Telephone Encounter (Signed)
Also patient is wanting the results to her labs. I do not see where Baldo Ash has read them yet.   Also about the flu shot she needs it done by today she states.

## 2016-09-02 ENCOUNTER — Encounter: Payer: Self-pay | Admitting: Gastroenterology

## 2016-10-25 DIAGNOSIS — R442 Other hallucinations: Secondary | ICD-10-CM | POA: Diagnosis not present

## 2016-11-03 ENCOUNTER — Telehealth: Payer: Self-pay | Admitting: Nurse Practitioner

## 2016-11-03 ENCOUNTER — Other Ambulatory Visit: Payer: Self-pay | Admitting: Nurse Practitioner

## 2016-11-03 DIAGNOSIS — R7989 Other specified abnormal findings of blood chemistry: Secondary | ICD-10-CM

## 2016-11-03 NOTE — Telephone Encounter (Signed)
Per Baldo Ash, lab enter, left detail massage inform pt to come to do lab.

## 2016-11-03 NOTE — Telephone Encounter (Signed)
Patient is needing to get TSH  Recheck. She is asking that you enter and give her a call asap to advise on her cell

## 2016-11-04 ENCOUNTER — Other Ambulatory Visit (INDEPENDENT_AMBULATORY_CARE_PROVIDER_SITE_OTHER): Payer: 59

## 2016-11-04 DIAGNOSIS — R7989 Other specified abnormal findings of blood chemistry: Secondary | ICD-10-CM

## 2016-11-04 DIAGNOSIS — R946 Abnormal results of thyroid function studies: Secondary | ICD-10-CM | POA: Diagnosis not present

## 2016-11-04 LAB — TSH: TSH: 5.26 u[IU]/mL — ABNORMAL HIGH (ref 0.35–4.50)

## 2016-11-04 LAB — T4, FREE: Free T4: 0.67 ng/dL (ref 0.60–1.60)

## 2016-11-07 ENCOUNTER — Telehealth: Payer: Self-pay | Admitting: Nurse Practitioner

## 2016-11-07 ENCOUNTER — Other Ambulatory Visit: Payer: Self-pay | Admitting: Geriatric Medicine

## 2016-11-07 MED ORDER — LEVOTHYROXINE SODIUM 25 MCG PO TABS: 25.0000 ug | ORAL_TABLET | Freq: Every day | ORAL | 3 refills | Status: DC

## 2016-11-07 MED FILL — LEVOTHYROXINE 25 MCG TABLET: 25 | 30 days supply | Qty: 30 | Fill #0

## 2016-11-07 NOTE — Telephone Encounter (Signed)
Please send prescription per TSH result note. Thank you

## 2016-11-07 NOTE — Telephone Encounter (Signed)
Sent to pharmacy 

## 2016-11-07 NOTE — Telephone Encounter (Signed)
Pt says that she received vm in regards to her needing Synthroid. She would like to have medication sent today because she would like to pick up this afternoon if possible.     Stefanie [name]

## 2016-11-10 ENCOUNTER — Encounter: Payer: Self-pay | Admitting: Nurse Practitioner

## 2016-12-09 MED FILL — LEVOTHYROXINE 25 MCG TABLET: 25 | 30 days supply | Qty: 30 | Fill #1

## 2016-12-14 DIAGNOSIS — M2550 Pain in unspecified joint: Secondary | ICD-10-CM | POA: Diagnosis not present

## 2016-12-14 DIAGNOSIS — M25521 Pain in right elbow: Secondary | ICD-10-CM | POA: Diagnosis not present

## 2016-12-14 DIAGNOSIS — R682 Dry mouth, unspecified: Secondary | ICD-10-CM | POA: Diagnosis not present

## 2016-12-14 DIAGNOSIS — H04123 Dry eye syndrome of bilateral lacrimal glands: Secondary | ICD-10-CM | POA: Diagnosis not present

## 2016-12-14 DIAGNOSIS — R5382 Chronic fatigue, unspecified: Secondary | ICD-10-CM | POA: Diagnosis not present

## 2016-12-14 DIAGNOSIS — M25522 Pain in left elbow: Secondary | ICD-10-CM | POA: Diagnosis not present

## 2016-12-14 LAB — BASIC METABOLIC PANEL
BUN: 8 mg/dL (ref 4–21)
Creatinine: 0.7 mg/dL (ref 0.5–1.1)
Glucose: 69 mg/dL
Potassium: 3.6 mmol/L (ref 3.4–5.3)
Sodium: 139 mmol/L (ref 137–147)

## 2016-12-14 LAB — CBC AND DIFFERENTIAL
HCT: 44 % (ref 36–46)
Hemoglobin: 15.1 g/dL (ref 12.0–16.0)
Platelets: 259 10*3/uL (ref 150–399)
WBC: 7.2 10^3/mL

## 2016-12-14 LAB — HEPATIC FUNCTION PANEL
ALT: 17 U/L (ref 7–35)
AST: 21 U/L (ref 13–35)
Alkaline Phosphatase: 57 U/L (ref 25–125)
Bilirubin, Total: 0.5 mg/dL

## 2016-12-30 ENCOUNTER — Encounter: Payer: Self-pay | Admitting: Nurse Practitioner

## 2016-12-30 NOTE — Progress Notes (Signed)
Abstracted result and sent to scan2/14/2018

## 2017-01-01 ENCOUNTER — Encounter: Payer: Self-pay | Admitting: Emergency Medicine

## 2017-01-01 ENCOUNTER — Emergency Department
Admission: EM | Admit: 2017-01-01 | Discharge: 2017-01-01 | Disposition: A | Discharge status: Home / Self Care | Payer: 59 | Source: Home / Self Care | Attending: Family Medicine | Admitting: Family Medicine

## 2017-01-01 DIAGNOSIS — R509 Fever, unspecified: Secondary | ICD-10-CM | POA: Diagnosis not present

## 2017-01-01 DIAGNOSIS — G44009 Cluster headache syndrome, unspecified, not intractable: Secondary | ICD-10-CM

## 2017-01-01 DIAGNOSIS — R5383 Other fatigue: Secondary | ICD-10-CM | POA: Diagnosis not present

## 2017-01-01 DIAGNOSIS — R69 Illness, unspecified: Secondary | ICD-10-CM | POA: Diagnosis not present

## 2017-01-01 DIAGNOSIS — M791 Myalgia: Secondary | ICD-10-CM | POA: Diagnosis not present

## 2017-01-01 DIAGNOSIS — J111 Influenza due to unidentified influenza virus with other respiratory manifestations: Secondary | ICD-10-CM

## 2017-01-01 MED ORDER — OSELTAMIVIR PHOSPHATE 75 MG PO CAPS: 75.0000 mg | ORAL_CAPSULE | Freq: Two times a day (BID) | ORAL | 0 refills | Status: DC

## 2017-01-01 NOTE — ED Triage Notes (Signed)
Pt c/o waking up this morning with body aches, dizzy, nausea, fever.

## 2017-01-01 NOTE — ED Provider Notes (Signed)
[facility]    CSN: [id number] Arrival date & time: 01/01/17  1438     History   Chief Complaint Chief Complaint  Patient presents with  . Generalized Body Aches    HPI Stefanie [name] is a 51 y.o. female.   Patient awoke this morning with flu-like illness including myalgias, headache, fever/chills, fatigue, mild nasal congestion and sore throat. No cough.  No pleuritic pain or shortness of breath.     The history is provided by the patient.    Past Medical History:  Diagnosis Date  . Brain aneurysm 09/2012 dx   follows at [facility] for same  . Heart palpitations    holter 10/2013 with few PVCs/PACs, normal stress echo 12/2013  . IBS (irritable bowel syndrome)   . Melanoma ([place]) 2013   RLE, follows with derm for same  . 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 Sjogren's syndrome ([place]) 07/23/2014   Dx summer 2015 -follows with rheum for same    Patient Active Problem List   Diagnosis Date Noted  . 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 Polyarthralgia 08/17/2016  . Sjogren's syndrome ([place]) 07/23/2014  . 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 ABDOMINAL HYSTERECTOMY    . BREAST BIOPSY Left   . CHOLECYSTECTOMY N/A 04/10/2015   Procedure: LAPAROSCOPIC CHOLECYSTECTOMY WITH INTRAOPERATIVE CHOLANGIOGRAM;  Surgeon: Jackolyn Confer, MD;  Location: [facility];  Service: General;  Laterality: N/A;  . COLONOSCOPY    . GALLBLADDER SURGERY  04/2015  . LASIK    . right leg fx    . speenoidectomy    . WISDOM TOOTH EXTRACTION      OB History    No data available       Home Medications    Prior to Admission medications   Medication Sig Start Date End Date Taking? Authorizing Provider  levothyroxine (SYNTHROID, LEVOTHROID) 25 MCG tablet Take 1 tablet (25 mcg total) by mouth  daily before breakfast. 11/07/16   Flossie Buffy, NP  oseltamivir (TAMIFLU) 75 MG capsule Take 1 capsule (75 mg total) by mouth every 12 (twelve) hours. 01/01/17   Kandra Nicolas, MD    Family History Family History  Problem Relation Age of Onset  . Rheum arthritis Mother   . Arthritis Mother   . Diabetes Father   . Heart disease Father 49    MI with CABG x 5 age 34  . Stroke Father   . Cancer Paternal Aunt   . Pancreatic cancer Cousin   . Heart attack Brother 36  . Cancer Maternal Aunt   . Esophageal cancer Paternal Uncle   . Rectal cancer Neg Hx   . Stomach cancer Neg Hx     Social History Social History  Substance Use Topics  . Smoking status: Never Smoker  . Smokeless tobacco: Never Used  . Alcohol use No     Allergies   Morphine and related; Other; Ray block sunscreen; Sulfa antibiotics; Sulfasalazine; Latex; and Morphine   Review of Systems Review of Systems + sore throat No cough No pleuritic pain No wheezing + nasal congestion + post-nasal drainage No sinus pain/pressure No itchy/red eyes No earache + dizzy No hemoptysis No SOB + fever, + chills + nausea No vomiting No abdominal pain No diarrhea No urinary symptoms No skin rash +  fatigue + myalgias + headache Used OTC meds without relief   Physical Exam Triage Vital Signs ED Triage Vitals  Enc Vitals Group     BP 01/01/17 1509 115/77     Pulse Rate 01/01/17 1509 (!) 125     Resp --      Temp 01/01/17 1509 99.1 F (37.3 C)     Temp Source 01/01/17 1509 Oral     SpO2 01/01/17 1509 97 %     Weight 01/01/17 1509 109 lb 8 oz (49.7 kg)     Height 01/01/17 1509 5\' 2"  (1.575 m)     Head Circumference --      Peak Flow --      Pain Score 01/01/17 1511 0     Pain Loc --      Pain Edu? --      Excl. in [place]? --    No data found.   Updated Vital Signs BP 115/77 (BP Location: Left Arm)   Pulse (!) 125   Temp 99.1 F (37.3 C) (Oral)   Ht 5\' 2"  (1.575 m)   Wt 109 lb 8 oz (49.7 kg)    SpO2 97%   BMI 20.03 kg/m   Visual Acuity Right Eye Distance:   Left Eye Distance:   Bilateral Distance:    Right Eye Near:   Left Eye Near:    Bilateral Near:     Physical Exam Nursing notes and Vital Signs reviewed. Appearance:  Patient appears stated age, and in no acute distress Eyes:  Pupils are equal, round, and reactive to light and accomodation.  Extraocular movement is intact.  Conjunctivae are not inflamed  Ears:  Canals normal.  Tympanic membranes normal.  Nose:  Mildly congested turbinates.  No sinus tenderness.  Pharynx:  Normal Neck:  Supple.  Tender enlarged posterior/lateral nodes are palpated bilaterally  Lungs:  Clear to auscultation.  Breath sounds are equal.  Moving air well. Heart:  Regular rate and rhythm without murmurs, rubs, or gallops.  Abdomen:  Nontender without masses or hepatosplenomegaly.  Bowel sounds are present.  No CVA or flank tenderness.  Extremities:  No edema.  Skin:  No rash present.    UC Treatments / Results  Labs (all labs ordered are listed, but only abnormal results are displayed) Labs Reviewed - No data to display  EKG  EKG Interpretation None       Radiology No results found.  Procedures Procedures (including critical care time)  Medications Ordered in UC Medications - No data to display   Initial Impression / Assessment and Plan / UC Course  I have reviewed the triage vital signs and the nursing notes.  Pertinent labs & imaging results that were available during my care of the patient were reviewed by me and considered in my medical decision making (see chart for details).    Begin Tamiflu. If cough develops, begin taking plain guaifenesin (1200mg  extended release tabs such as Mucinex) twice daily, with plenty of water, for cough and congestion.  May add Pseudoephedrine (30mg , one or two every 4 to 6 hours) for sinus congestion.  Get adequate rest.   May take Delsym Cough Suppressant at bedtime for nighttime  cough.  Try warm salt water gargles for sore throat.  Stop all antihistamines for now, and other non-prescription cough/cold preparations. May take Ibuprofen 200mg , 4 tabs every 8 hours with food for body aches, headache, fever, etc. Followup with Family Doctor if not improved in 5 days.  Final Clinical Impressions(s) / UC Diagnoses   Final diagnoses:  Influenza-like illness    New Prescriptions New Prescriptions   OSELTAMIVIR (TAMIFLU) 75 MG CAPSULE    Take 1 capsule (75 mg total) by mouth every 12 (twelve) hours.     Kandra Nicolas, MD 01/03/17 [phone]

## 2017-01-01 NOTE — Discharge Instructions (Signed)
If cough develops, begin taking plain guaifenesin (1200mg  extended release tabs such as Mucinex) twice daily, with plenty of water, for cough and congestion.  May add Pseudoephedrine (30mg , one or two every 4 to 6 hours) for sinus congestion.  Get adequate rest.   May take Delsym Cough Suppressant at bedtime for nighttime cough.  Try warm salt water gargles for sore throat.  Stop all antihistamines for now, and other non-prescription cough/cold preparations. May take Ibuprofen 200mg , 4 tabs every 8 hours with food for body aches, headache, fever, etc.

## 2017-01-04 MED FILL — LEVOTHYROXINE 25 MCG TABLET: 25 | 60 days supply | Qty: 60 | Fill #2

## 2017-01-05 ENCOUNTER — Other Ambulatory Visit: Payer: Self-pay | Admitting: Nurse Practitioner

## 2017-02-03 ENCOUNTER — Ambulatory Visit: Payer: 59 | Admitting: Nurse Practitioner

## 2017-03-10 ENCOUNTER — Ambulatory Visit (INDEPENDENT_AMBULATORY_CARE_PROVIDER_SITE_OTHER): Payer: PRIVATE HEALTH INSURANCE | Admitting: Nurse Practitioner

## 2017-03-10 ENCOUNTER — Encounter: Payer: Self-pay | Admitting: Nurse Practitioner

## 2017-03-10 VITALS — BP 104/70 | HR 66 | Temp 97.9°F | Ht 62.0 in | Wt 105.0 lb

## 2017-03-10 DIAGNOSIS — E039 Hypothyroidism, unspecified: Secondary | ICD-10-CM | POA: Diagnosis not present

## 2017-03-10 DIAGNOSIS — M26621 Arthralgia of right temporomandibular joint: Secondary | ICD-10-CM

## 2017-03-10 DIAGNOSIS — B001 Herpesviral vesicular dermatitis: Secondary | ICD-10-CM

## 2017-03-10 MED ORDER — LEVOTHYROXINE SODIUM 25 MCG PO TABS: 25.0000 ug | ORAL_TABLET | Freq: Every day | ORAL | 1 refills | Status: AC

## 2017-03-10 MED ORDER — ACYCLOVIR 5 % EX CREA: 1.0000 "application " | TOPICAL_CREAM | CUTANEOUS | 0 refills | Status: AC

## 2017-03-10 MED ORDER — VALACYCLOVIR HCL 1 G PO TABS: 1000.0000 mg | ORAL_TABLET | Freq: Three times a day (TID) | ORAL | 0 refills | Status: AC

## 2017-03-10 MED FILL — LEVOTHYROXINE 25 MCG TABLET: 25 | 30 days supply | Qty: 30 | Fill #0 | Status: TO

## 2017-03-10 NOTE — Progress Notes (Signed)
Subjective:  Patient ID: Stefanie [name], female    DOB: 1965/12/12  Age: 51 y.o. MRN: [id number]  CC: Follow-up (3 mo fu--need syntroid refill---right ear pain 3 days--[facility] sent note to [facility] with sugestion.)   Otalgia   There is pain in the right ear. This is a new problem. The current episode started in the past 7 days. Episode frequency: intermittent. The problem has been unchanged. There has been no fever. Pertinent negatives include no abdominal pain, coughing, diarrhea, ear discharge, headaches, hearing loss, neck pain, rash, rhinorrhea, sore throat or vomiting. She has tried nothing for the symptoms. There is no history of a chronic ear infection, hearing loss or a tympanostomy tube.  Rash  This is a recurrent problem. The current episode started in the past 7 days. The problem has been resolved since onset. The affected locations include the lips. The rash is characterized by blistering, pain and redness. She was exposed to nothing. Pertinent negatives include no anorexia, congestion, cough, diarrhea, eye pain, facial edema, fatigue, fever, joint pain, nail changes, rhinorrhea, sore throat or vomiting. Treatments tried: Abreva. The treatment provided significant relief. Her past medical history is significant for varicella. There is no history of allergies or eczema.  she will like refill for valtrex.  Right jaw and ear pain is aggravated by palpation. Hx of herpes zoster. She is concerned about another outbreak on right side of face. Denies any recent dental procedure.  Hypothyroidism: Taking medication as prescribed. Denies any adverse side effects. Reports improvement in musculoskeletal symptoms and energy.  Outpatient Medications Prior to Visit  Medication Sig Dispense Refill  . levothyroxine (SYNTHROID, LEVOTHROID) 25 MCG tablet Take 1 tablet (25 mcg total) by mouth daily before breakfast. 30 tablet 3  . oseltamivir (TAMIFLU) 75 MG capsule Take 1 capsule (75 mg total) by  mouth every 12 (twelve) hours. (Patient not taking: Reported on 03/10/2017) 10 capsule 0   No facility-administered medications prior to visit.     ROS See HPI  Objective:  BP 104/70   Pulse 66   Temp 97.9 F (36.6 C)   Ht 5\' 2"  (1.575 m)   Wt 105 lb (47.6 kg)   SpO2 99%   BMI 19.20 kg/m   BP Readings from Last 3 Encounters:  03/10/17 104/70  01/01/17 115/77  08/17/16 109/86    Wt Readings from Last 3 Encounters:  03/10/17 105 lb (47.6 kg)  01/01/17 109 lb 8 oz (49.7 kg)  08/17/16 109 lb (49.4 kg)    Physical Exam  Constitutional: She is oriented to person, place, and time. No distress.  HENT:  Right Ear: External ear normal.  Left Ear: External ear normal.  Nose: Nose normal.  Mouth/Throat: Oropharynx is clear and moist. No oropharyngeal exudate.  Eyes: Conjunctivae and EOM are normal. Pupils are equal, round, and reactive to light. No scleral icterus.  Neck: No thyromegaly present.  Cardiovascular: Normal rate and normal heart sounds.   Pulmonary/Chest: Effort normal and breath sounds normal.  Musculoskeletal: Normal range of motion. She exhibits no edema.  Lymphadenopathy:    She has no cervical adenopathy.  Neurological: She is alert and oriented to person, place, and time.  Skin: Skin is warm and dry.  Vitals reviewed.   Lab Results  Component Value Date   WBC 7.2 12/14/2016   HGB 15.1 12/14/2016   HCT 44 12/14/2016   PLT 259 12/14/2016   GLUCOSE 89 08/17/2016   CHOL 227 (H) 08/17/2016   TRIG 92.0 08/17/2016  HDL 76.20 08/17/2016   LDLCALC 133 (H) 08/17/2016   ALT 17 12/14/2016   AST 21 12/14/2016   NA 139 12/14/2016   K 3.6 12/14/2016   CL 103 08/17/2016   CREATININE 0.7 12/14/2016   BUN 8 12/14/2016   CO2 30 08/17/2016   TSH 5.26 (H) 11/04/2016   INR 1.05 04/02/2015   HGBA1C 5.4 04/22/2012    No results found.  Assessment & Plan:   Janalee was seen today for follow-up.  Diagnoses and all orders for this visit:  Hypothyroidism,  unspecified type -     levothyroxine (SYNTHROID, LEVOTHROID) 25 MCG tablet; Take 1 tablet (25 mcg total) by mouth daily before breakfast.  Herpes labialis -     valACYclovir (VALTREX) 1000 MG tablet; Take 1 tablet (1,000 mg total) by mouth 3 (three) times daily. -     acyclovir cream (ZOVIRAX) 5 %; Apply 1 application topically every 3 (three) hours.  TMJ tenderness, right   I have discontinued Ms. Louk's oseltamivir. I am also having her start on valACYclovir and acyclovir cream. Additionally, I am having her maintain her levothyroxine.  Meds ordered this encounter  Medications  . levothyroxine (SYNTHROID, LEVOTHROID) 25 MCG tablet    Sig: Take 1 tablet (25 mcg total) by mouth daily before breakfast.    Dispense:  90 tablet    Refill:  1    Order Specific Question:   Supervising Provider    Answer:   Cassandria Anger [[id number]]  . valACYclovir (VALTREX) 1000 MG tablet    Sig: Take 1 tablet (1,000 mg total) by mouth 3 (three) times daily.    Dispense:  21 tablet    Refill:  0    Order Specific Question:   Supervising Provider    Answer:   Cassandria Anger [[id number]]  . acyclovir cream (ZOVIRAX) 5 %    Sig: Apply 1 application topically every 3 (three) hours.    Dispense:  5 g    Refill:  0    Order Specific Question:   Supervising Provider    Answer:   Cassandria Anger [[id number]]    Follow-up: Return in about 5 months (around 08/15/2017) for CPE (fasting) and hypothyroidism.  Wilfred Lacy, NP

## 2017-03-10 NOTE — Assessment & Plan Note (Signed)
[facility] think musculoskeletal symptoms are related to hypothyroidism.

## 2017-03-10 NOTE — Patient Instructions (Signed)
Jaw Range of Motion Exercises Jaw range of motion exercises are exercises that help your jaw to move better. These exercises can help to prevent:  Difficulty opening your mouth.  Pain in your jaw while it is both open and closed. What should I be careful of when doing jaw exercises? Make sure that you only do jaw exercises as directed by your health care provider. You should only move your jaw as far as it can go in each direction, if told to do so by your health care provider. Do not move your jaw into positions that cause you any pain. What exercises should I do?  Stick your jaw forward. Hold this position for 1-2 seconds. Allow your jaw to return to its normal position and rest it there for 1-2 seconds. Do this exercise 8 times.  Stand or sit in front of a mirror. Place your tongue on the roof of your mouth, just behind your top teeth. Slowly open and close your jaw, keeping your tongue on the roof of your mouth. While you open and close your mouth, try to keep your jaw from moving toward one side or the other. Repeat this 8 times.  Move your jaw right. Hold this position for 1-2 seconds. Allow your jaw to return to its normal position, and rest it there for 1-2 seconds. Do this exercise 8 times.  Move your jaw left. Hold this position for 1-2 seconds. Allow your jaw to return to its normal position, and rest it there for 1-2 seconds. Do this exercise 8 times.  Open your mouth as far as it is can comfortably go. Hold this position for 1-2 seconds. Then close your mouth and rest for 1-2 seconds. Do this exercise 8 times.  Move your jaw in a circular motion, starting toward the right (clockwise). Repeat this 8 times.  Move your jaw in a circular motion, starting toward the left (counterclockwise). Repeat this 8 times. Apply moist heat packs or ice packs to your jaw before or after performing your exercises as directed by your health care provider. What else can I do? Avoid the following, if  they cause jaw pain or they increase your jaw pain:  Chewing gum.  Clenching your jaw or teeth or keeping tension in your jaw muscles.  Leaning on your jaw, such as resting your jaw in your hand while leaning on a desk. This information is not intended to replace advice given to you by your health care provider. Make sure you discuss any questions you have with your health care provider. Document Released: 09/29/2008 Document Revised: 03/24/2016 Document Reviewed: 09/17/2014 Elsevier Interactive Patient Education  2017 [employer].

## 2017-03-10 NOTE — Progress Notes (Signed)
Pre visit review using our clinic review tool, if applicable. No additional management support is needed unless otherwise documented below in the visit note. 

## 2017-04-07 ENCOUNTER — Other Ambulatory Visit: Payer: Self-pay | Admitting: Nurse Practitioner

## 2017-04-07 MED FILL — LEVOTHYROXINE 25 MCG TABLET: 25 | 30 days supply | Qty: 30 | Fill #0

## 2017-04-07 NOTE — Telephone Encounter (Signed)
Pt called and stated [facility] outpatient will be sending request to have her Levothyroxine filled. She had to used new mail service, and will  Be out of med by the time she receive. Inform we have received request 30 day has been sent to [place]...Johny Chess

## 2017-04-10 ENCOUNTER — Other Ambulatory Visit: Payer: Self-pay | Admitting: Obstetrics & Gynecology

## 2017-04-10 DIAGNOSIS — Z1231 Encounter for screening mammogram for malignant neoplasm of breast: Secondary | ICD-10-CM

## 2017-04-17 ENCOUNTER — Ambulatory Visit
Admission: RE | Admit: 2017-04-17 | Discharge: 2017-04-17 | Disposition: A | Discharge status: Home / Self Care | Payer: PRIVATE HEALTH INSURANCE | Source: Ambulatory Visit | Attending: Obstetrics & Gynecology | Admitting: Obstetrics & Gynecology

## 2017-04-17 DIAGNOSIS — Z1231 Encounter for screening mammogram for malignant neoplasm of breast: Secondary | ICD-10-CM

## 2017-07-10 MED FILL — valACYclovir HCL 1 GM TABS: 1 | 7 days supply | Qty: 21 | Fill #0

## 2017-08-03 ENCOUNTER — Other Ambulatory Visit: Payer: Self-pay | Admitting: *Deleted

## 2017-08-03 MED ORDER — LEVOTHYROXINE SODIUM 25 MCG PO TABS: 25.0000 ug | ORAL_TABLET | Freq: Every day | ORAL | 0 refills | Status: AC

## 2017-08-03 NOTE — Telephone Encounter (Signed)
Rec'd call pt states she has appt on 08/18/17 w/[facility] but will be out before appt. Requesting refill until appt. Inform can send 30 day only to local pharmacy. Sent electronically...Johny Chess

## 2017-08-04 MED FILL — LEVOTHYROXINE 25 MCG TABLET: 25 | 30 days supply | Qty: 30 | Fill #0

## 2017-08-04 NOTE — Telephone Encounter (Signed)
Rec'd call from pt stating [facility] has faxed over some type of form for her thyroid medication. They stated they can not fill until they receive back, and her daughter is picking med up today. Inform pt will contact pharmacy to f/u. Called [facility] spoke w/Kyndall she states they are switching manufacturing from [employer] to [employer]. Needing ok to fill Mylan. Inform Kyndal ok to fill pt is ok with changing manufacturing...Johny Chess

## 2017-08-11 MED FILL — LEVOTHYROXINE 25 MCG TABLET: 25 | 30 days supply | Qty: 30 | Fill #0

## 2017-08-18 ENCOUNTER — Encounter: Payer: PRIVATE HEALTH INSURANCE | Admitting: Nurse Practitioner

## 2017-09-02 IMAGING — MG MM SCREENING BREAST TOMO BILATERAL
9 of 12 series · 9 of 28 positions shown · non-contrast
Comparison: Previous exam(s).

CLINICAL DATA: Screening.

EXAM:
2D DIGITAL SCREENING BILATERAL MAMMOGRAM WITH CAD AND ADJUNCT TOMO

[R CC synth-2D]
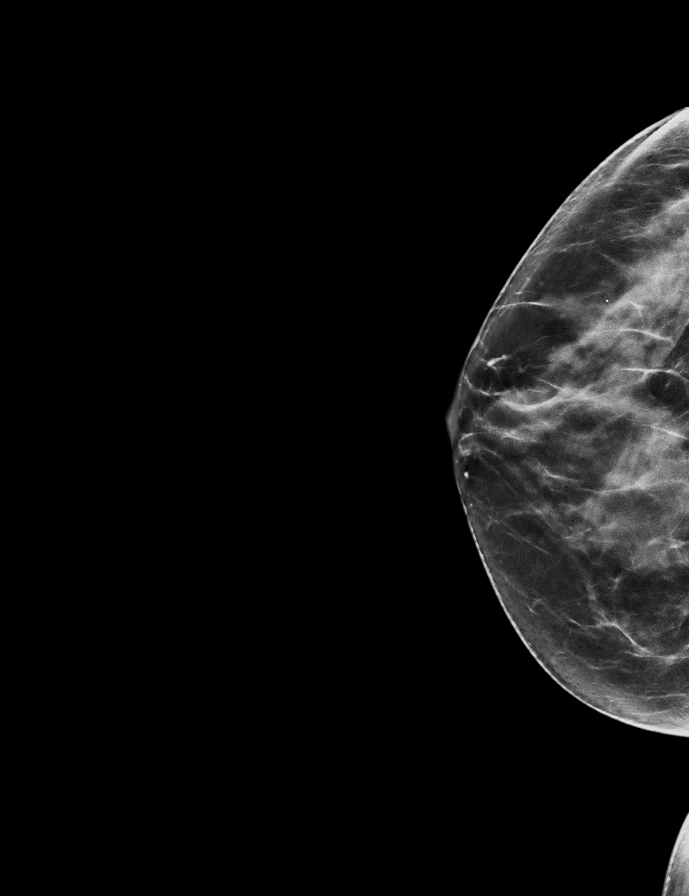

[L CC]
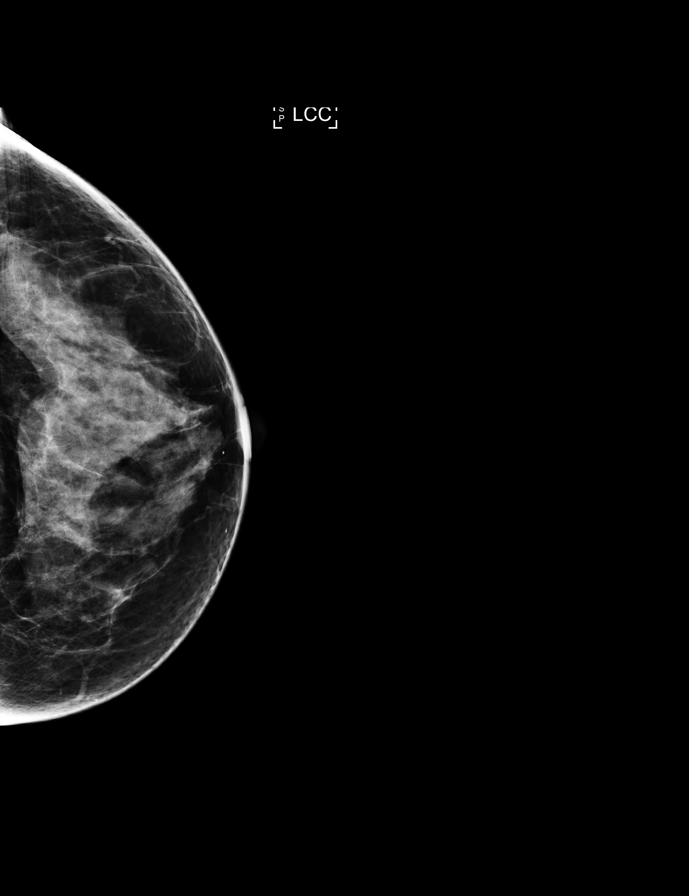

[L MLO]
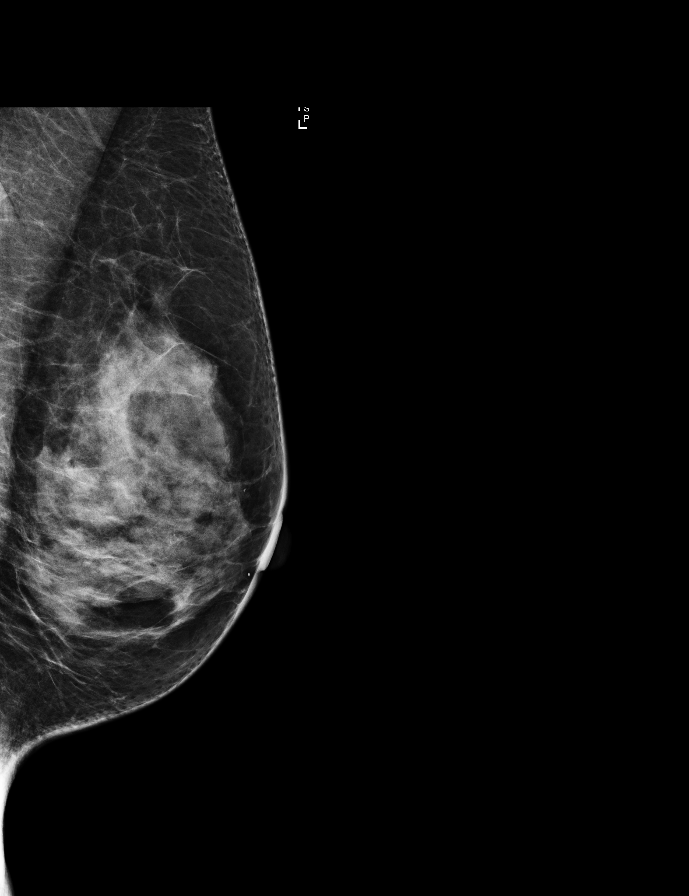

[R MLO synth-2D]
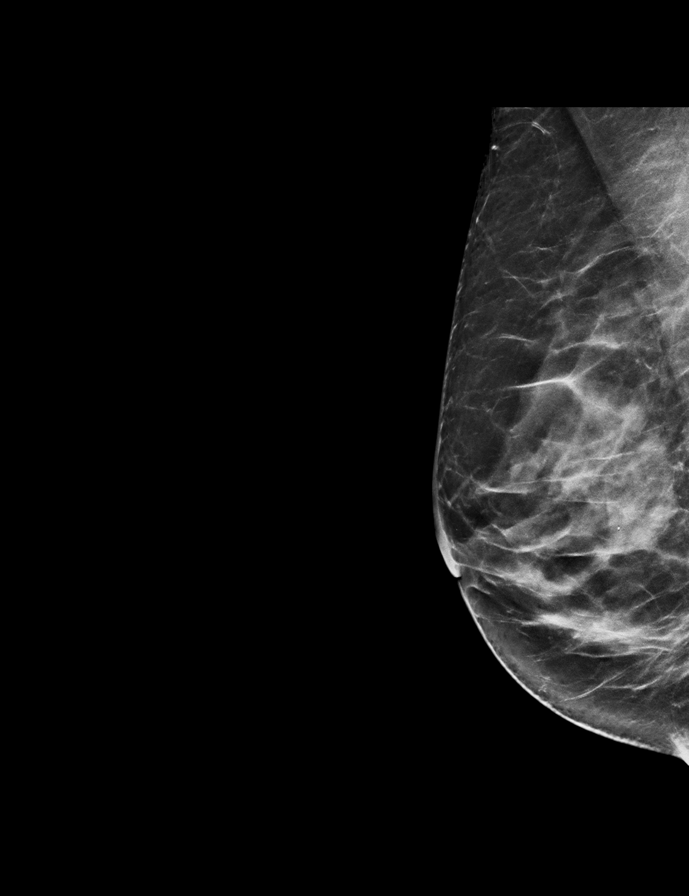

[R CC]
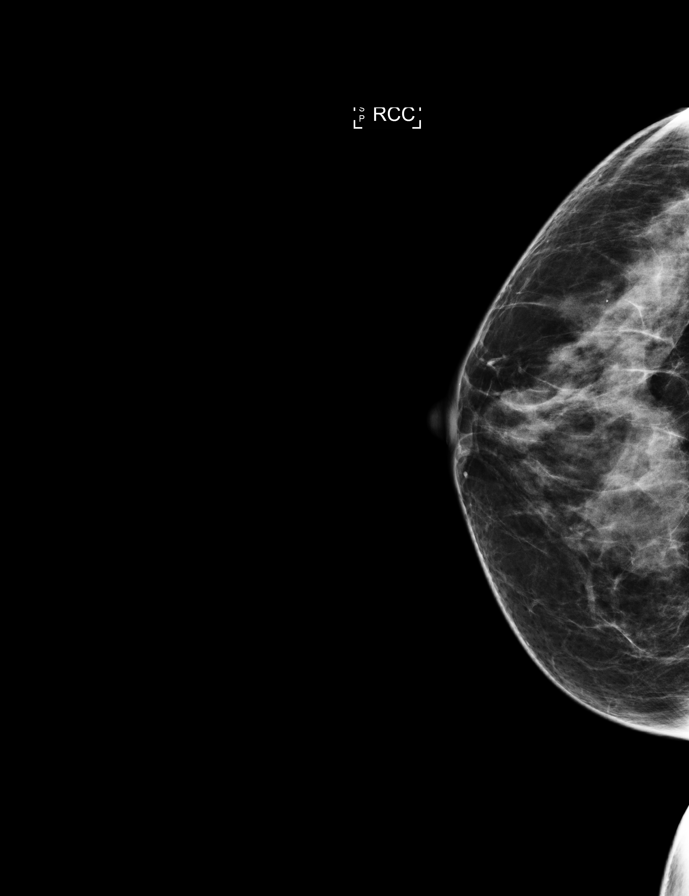

[R MLO]
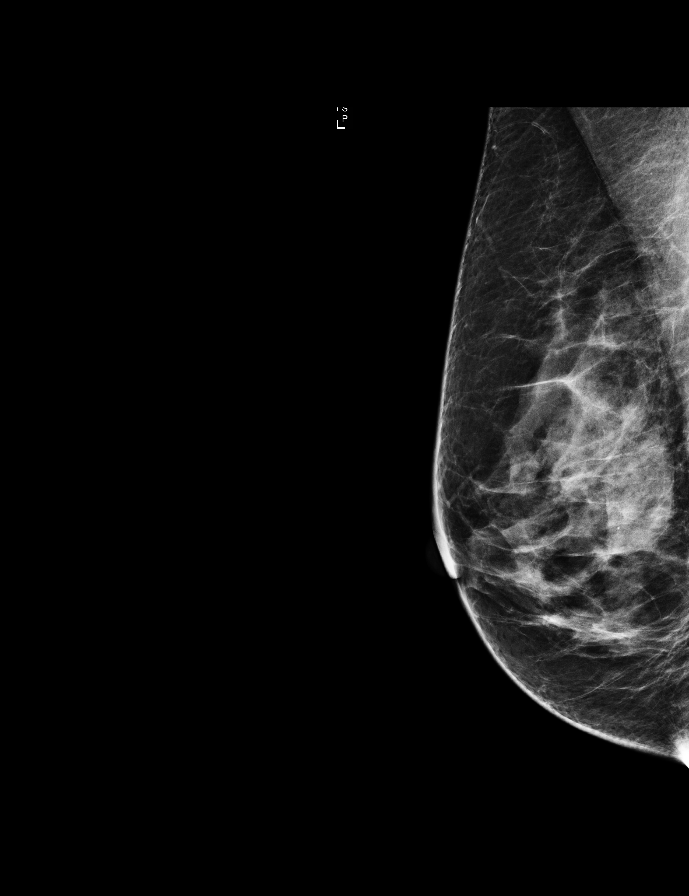

[L CC synth-2D]
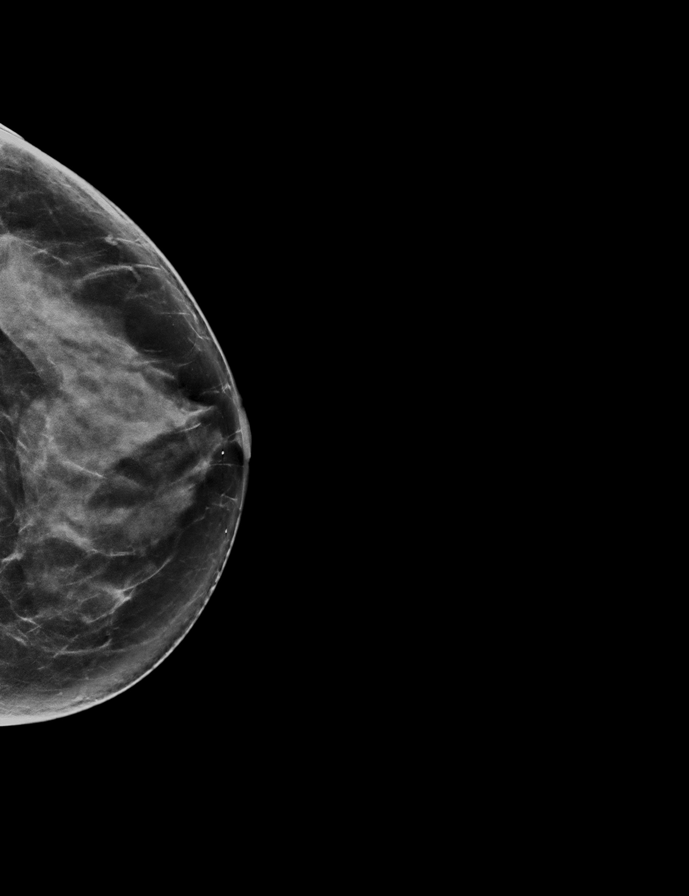

[L MLO synth-2D]
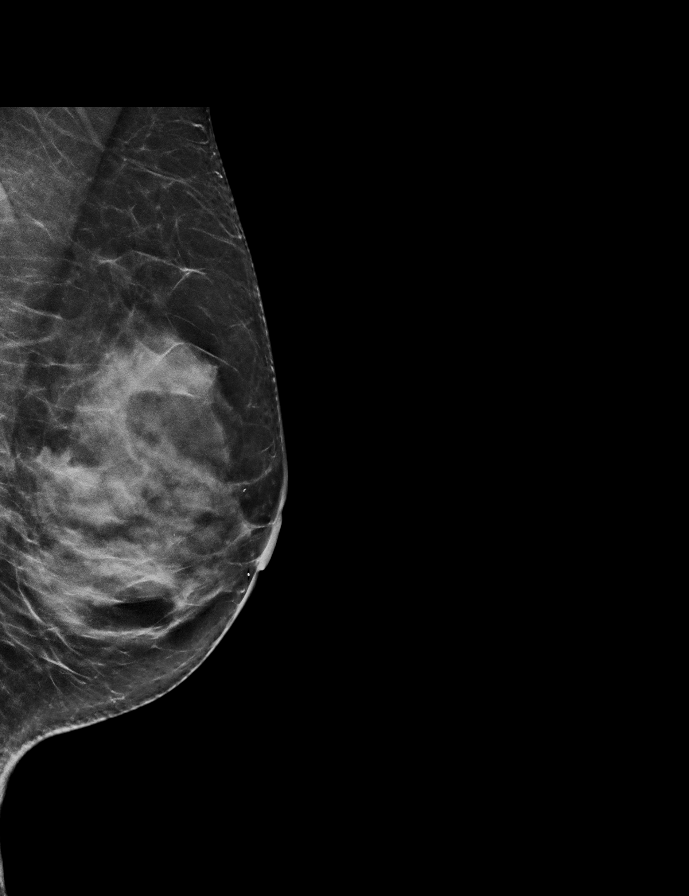

[L MLO tomo · tomo slice 31/60.0]
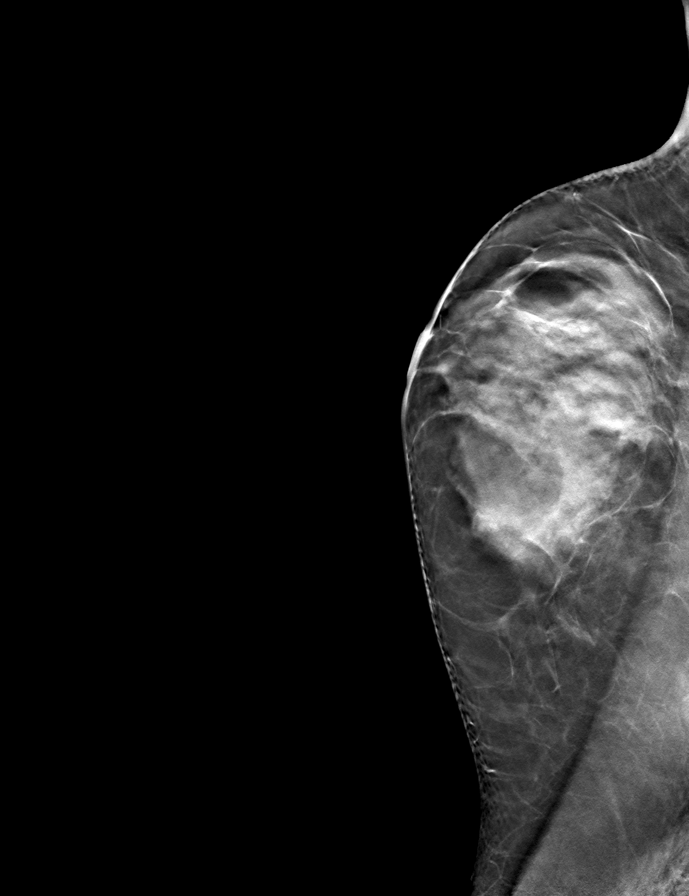

[9 of 28 positions shown; findings below may reference images not displayed]

ACR Breast Density Category c: The breast tissue is heterogeneously
dense, which may obscure small masses.
FINDINGS: There are no findings suspicious for malignancy. Images were
processed with CAD.
IMPRESSION: No mammographic evidence of malignancy. A result letter of this
screening mammogram will be mailed directly to the patient.

RECOMMENDATION:
Screening mammogram in one year. (Code:TN-[id number])

BI-RADS CATEGORY  1: Negative.

## 2018-03-02 ENCOUNTER — Other Ambulatory Visit: Payer: Self-pay | Admitting: Obstetrics & Gynecology

## 2018-03-02 DIAGNOSIS — Z1231 Encounter for screening mammogram for malignant neoplasm of breast: Secondary | ICD-10-CM
